# Patient Record
Sex: Female | Born: 1970 | Race: Black or African American | Hispanic: No | Marital: Single | Smoking: Never smoker
Health system: Southern US, Community
[De-identification: ages and names within clinical notes are randomized; demographics above are authoritative.]

## PROBLEM LIST (undated history)

## (undated) DIAGNOSIS — G43909 Migraine, unspecified, not intractable, without status migrainosus: Secondary | ICD-10-CM

## (undated) DIAGNOSIS — N760 Acute vaginitis: Secondary | ICD-10-CM

## (undated) DIAGNOSIS — F419 Anxiety disorder, unspecified: Secondary | ICD-10-CM

## (undated) DIAGNOSIS — M1990 Unspecified osteoarthritis, unspecified site: Secondary | ICD-10-CM

## (undated) DIAGNOSIS — N390 Urinary tract infection, site not specified: Secondary | ICD-10-CM

## (undated) DIAGNOSIS — B9689 Other specified bacterial agents as the cause of diseases classified elsewhere: Secondary | ICD-10-CM

## (undated) HISTORY — PX: ABDOMINAL HYSTERECTOMY: SHX81

## (undated) HISTORY — DX: Migraine, unspecified, not intractable, without status migrainosus: G43.909

---

## 2003-05-02 ENCOUNTER — Other Ambulatory Visit: Admission: RE | Admit: 2003-05-02 | Discharge: 2003-05-02 | Payer: Self-pay | Admitting: *Deleted

## 2003-05-06 ENCOUNTER — Emergency Department (HOSPITAL_COMMUNITY): Admission: EM | Admit: 2003-05-06 | Discharge: 2003-05-06 | Payer: Self-pay | Admitting: Emergency Medicine

## 2003-11-09 ENCOUNTER — Emergency Department (HOSPITAL_COMMUNITY): Admission: EM | Admit: 2003-11-09 | Discharge: 2003-11-09 | Payer: Self-pay | Admitting: Family Medicine

## 2003-12-26 ENCOUNTER — Emergency Department (HOSPITAL_COMMUNITY): Admission: EM | Admit: 2003-12-26 | Discharge: 2003-12-26 | Payer: Self-pay | Admitting: Family Medicine

## 2004-05-06 ENCOUNTER — Other Ambulatory Visit: Admission: RE | Admit: 2004-05-06 | Discharge: 2004-05-06 | Payer: Self-pay | Admitting: Obstetrics and Gynecology

## 2004-05-15 ENCOUNTER — Encounter (INDEPENDENT_AMBULATORY_CARE_PROVIDER_SITE_OTHER): Payer: Self-pay | Admitting: *Deleted

## 2004-05-15 ENCOUNTER — Ambulatory Visit (HOSPITAL_COMMUNITY): Admission: RE | Admit: 2004-05-15 | Discharge: 2004-05-15 | Payer: Self-pay | Admitting: Obstetrics and Gynecology

## 2004-12-24 ENCOUNTER — Ambulatory Visit: Payer: Self-pay | Admitting: Internal Medicine

## 2005-01-19 ENCOUNTER — Emergency Department (HOSPITAL_COMMUNITY): Admission: EM | Admit: 2005-01-19 | Discharge: 2005-01-19 | Payer: Self-pay | Admitting: Family Medicine

## 2005-07-07 ENCOUNTER — Emergency Department (HOSPITAL_COMMUNITY): Admission: EM | Admit: 2005-07-07 | Discharge: 2005-07-07 | Payer: Self-pay | Admitting: Family Medicine

## 2005-10-18 ENCOUNTER — Emergency Department (HOSPITAL_COMMUNITY): Admission: EM | Admit: 2005-10-18 | Discharge: 2005-10-18 | Payer: Self-pay | Admitting: Family Medicine

## 2006-10-31 ENCOUNTER — Emergency Department (HOSPITAL_COMMUNITY): Admission: EM | Admit: 2006-10-31 | Discharge: 2006-10-31 | Payer: Self-pay | Admitting: Family Medicine

## 2007-09-09 ENCOUNTER — Emergency Department (HOSPITAL_COMMUNITY): Admission: EM | Admit: 2007-09-09 | Discharge: 2007-09-09 | Payer: Self-pay | Admitting: Family Medicine

## 2007-10-05 ENCOUNTER — Emergency Department (HOSPITAL_COMMUNITY): Admission: EM | Admit: 2007-10-05 | Discharge: 2007-10-06 | Payer: Self-pay | Admitting: Emergency Medicine

## 2007-10-11 ENCOUNTER — Ambulatory Visit: Payer: Self-pay | Admitting: Gastroenterology

## 2007-10-29 ENCOUNTER — Emergency Department (HOSPITAL_COMMUNITY): Admission: EM | Admit: 2007-10-29 | Discharge: 2007-10-29 | Payer: Self-pay | Admitting: Family Medicine

## 2009-11-10 ENCOUNTER — Emergency Department (HOSPITAL_COMMUNITY): Admission: EM | Admit: 2009-11-10 | Discharge: 2009-11-11 | Payer: Self-pay | Admitting: Emergency Medicine

## 2009-11-13 ENCOUNTER — Encounter: Admission: RE | Admit: 2009-11-13 | Discharge: 2009-11-13 | Payer: Self-pay | Admitting: Emergency Medicine

## 2010-04-06 ENCOUNTER — Emergency Department (HOSPITAL_COMMUNITY)
Admission: EM | Admit: 2010-04-06 | Discharge: 2010-04-06 | Payer: Self-pay | Source: Home / Self Care | Admitting: Emergency Medicine

## 2010-06-30 LAB — POCT URINALYSIS DIPSTICK
Glucose, UA: NEGATIVE mg/dL
Ketones, ur: NEGATIVE mg/dL
Specific Gravity, Urine: 1.01 (ref 1.005–1.030)
Urobilinogen, UA: 1 mg/dL (ref 0.0–1.0)

## 2010-06-30 LAB — GC/CHLAMYDIA PROBE AMP, GENITAL
Chlamydia, DNA Probe: NEGATIVE
GC Probe Amp, Genital: NEGATIVE

## 2010-08-20 ENCOUNTER — Inpatient Hospital Stay (INDEPENDENT_AMBULATORY_CARE_PROVIDER_SITE_OTHER)
Admission: RE | Admit: 2010-08-20 | Discharge: 2010-08-20 | Disposition: A | Discharge status: Home / Self Care | Payer: 59 | Source: Ambulatory Visit | Attending: Family Medicine | Admitting: Family Medicine

## 2010-08-20 ENCOUNTER — Ambulatory Visit (INDEPENDENT_AMBULATORY_CARE_PROVIDER_SITE_OTHER): Payer: 59

## 2010-08-20 DIAGNOSIS — M549 Dorsalgia, unspecified: Secondary | ICD-10-CM

## 2010-08-20 DIAGNOSIS — N760 Acute vaginitis: Secondary | ICD-10-CM

## 2010-08-20 LAB — POCT URINALYSIS DIP (DEVICE)
Bilirubin Urine: NEGATIVE
Glucose, UA: NEGATIVE mg/dL
Hgb urine dipstick: NEGATIVE
Nitrite: NEGATIVE
Specific Gravity, Urine: 1.025 (ref 1.005–1.030)

## 2010-08-20 LAB — WET PREP, GENITAL
Trich, Wet Prep: NONE SEEN
Yeast Wet Prep HPF POC: NONE SEEN

## 2010-08-21 LAB — GC/CHLAMYDIA PROBE AMP, GENITAL: Chlamydia, DNA Probe: NEGATIVE

## 2010-08-22 LAB — POCT I-STAT, CHEM 8
BUN: 9 mg/dL (ref 6–23)
Calcium, Ion: 1.15 mmol/L (ref 1.12–1.32)
Chloride: 104 mEq/L (ref 96–112)
Glucose, Bld: 88 mg/dL (ref 70–99)
TCO2: 25 mmol/L (ref 0–100)

## 2010-09-05 NOTE — Op Note (Signed)
[name], [name] NO.:  [id number]   MEDICAL RECORD NO.:  [id number]          PATIENT TYPE:  AMB   LOCATION:  SDC                           FACILITY:  [facility]   PHYSICIAN:  Juluis Mire, M.D.   DATE OF BIRTH:  10/25/1970   DATE OF PROCEDURE:  05/15/2004  DATE OF DISCHARGE:                                 OPERATIVE REPORT   PREOPERATIVE DIAGNOSES:  1.  Menorrhagia.  2.  Dysmenorrhea.   POSTOPERATIVE DIAGNOSIS:  Adenomyosis.   OPERATIVE PROCEDURES:  1.  Diagnostic laparoscopy.  2.  Hysteroscopy.  3.  Endometrial curettings.  4.  NovaSure ablation.   SURGEON:  Juluis Mire, M.D.   ANESTHESIA:  General.   ESTIMATED BLOOD LOSS:  Minimal.   PACKS AND DRAINS:  None.   </INTRAOPERATIVE BLOOD REPLACED>  None.   COMPLICATIONS:  None.   INDICATIONS:  Dictated in the history and physical.   The procedure was as follows:  The patient was taken to the OR and placed in  the supine position.  After a satisfactory level of general endotracheal  anesthesia was obtained, the patient was placed in the dorsal lithotomy  position using the Allen stirrups.  The abdomen, perineum and vagina were  prepped out with Betadine.  The bladder was emptied by in-and-out  catheterization.  A Hulka tenaculum was put in place and secured.  The  patient was draped as a sterile field.  A subumbilical incision made with a  knife.  The Veress needle was introduced into the abdominal cavity.  The  abdomen was inflated with approximately 3 L of carbon dioxide.  The  operating laparoscope was then introduced.  There was no evidence of injury  to adjacent organs.  The appendix was visualized and noted to be normal.  The upper abdomen including the liver and tip of the gallbladder was clear.  A 5 mm trocar was put in place in the suprapubic area under direct  visualization.  The uterus was elevated.  The uterus was grossly enlarged,  consistent with probable adenomyosis.  Tubes and  ovaries were otherwise  unremarkable except for evidence of a previous bilateral tubal ligation.  She had some minimal adhesions at the bladder flap area from prior cesarean  sections.  There was no active endometriosis or other pelvic adhesions.  At  this point in time the abdomen was deflated of its carbon dioxide, all  trocars removed.  The subumbilical incision was closed with interrupted  subcuticular of 4-0 Vicryl.  The suprapubic incision was closed with  Dermabond.   The patient's legs were repositioned, the Hulka tenaculum then removed.  The  speculum was placed in the vaginal vault.  The cervix was grasped with a  single-tooth tenaculum.  The cervical length was measured at 4.5 cm.  Endometrial sounding was 9 cm.  The cervix was serially dilated to a size 27  Pratt dilator.  The nonoperative hysteroscope was introduced.  The  intrauterine cavity was distended using lactated Ringer's.  Visualization  revealed no evidence of intrauterine abnormalities.  The hysteroscope was  then removed.  Endometrial  curettings were obtained.  At this point in time  the NovaSure was introduced.  We had a total cavity length of 4.5 cm.  It  was properly positioned.  We ended up with a cavity width of 5 cm.  We did  pass the CO2 test.  We then did the endometrial ablation that lasted 1  minute 40 seconds to a total power of 124.  The NovaSure was then removed  intact.  The single-tooth tenaculum and speculum were then removed, the  patient taken out of the dorsal lithotomy position and once alert and  extubated, transferred to the recovery room in good condition.  Sponge,  instrument and needle count reported as correct by the circulating nurse x2.      JSM/MEDQ  D:  05/15/2004  T:  05/15/2004  Job:  [id number]

## 2010-09-05 NOTE — H&P (Signed)
NAME:  [name], WEIR NO.:  [id number]   MEDICAL RECORD NO.:  [id number]          PATIENT TYPE:  AMB   LOCATION:  SDC                           FACILITY:  [facility]   PHYSICIAN:  Juluis Mire, M.D.   DATE OF BIRTH:  1971/03/29   DATE OF ADMISSION:  05/15/2004  DATE OF DISCHARGE:                                HISTORY & PHYSICAL   HISTORY OF PRESENT ILLNESS:  The patient is a 40 year old gravida 2, para 2  female who presents for diagnostic laparoscopy.  She is also going to have a  hysteroscopy and NovaSure ablation.   In relation to the present admission, the patient's cycles are regular.  She  has 8-9 days of flow.  She will change pads and tampons 8-9 times per day.  She has associated pelvic pain and discomfort; she is also having pain with  intercourse.  She underwent a previous saline-infusion ultrasound.  There  were several fibroids, the largest measuring 2.4 cm.  The endometrial cavity  was unremarkable.  We felt like the majority of her bleeding was from an  adenomyosis and now she presents for the above-noted procedure.   ALLERGIES:  In terms of allergies, the patient has no known drug allergies.   MEDICATIONS:  Zyrtec.   PAST MEDICAL HISTORY:  Usual childhood diseases, no significant sequelae.   PAST SURGICAL HISTORY:  She has had 2 cesarean sections, her tubes tied with  the last.  She has also had a loop excision procedure of the cervix and  followup laser ablation; this was for cervical dysplasia.  Her last Pap  smear was within normal limits.   FAMILY HISTORY:  Her father has a history of insulin-dependent diabetes.  Mother died from lung cancer.   SOCIAL HISTORY:  There is no tobacco or alcohol use.   REVIEW OF SYSTEMS:  Review of systems is noncontributory.   PHYSICAL EXAM:  VITAL SIGNS:  The patient is afebrile with stable vital  signs.  HEENT:  Patient is normocephalic.  Pupils are equal, round and reactive to  light and accommodation.   Extraocular movements were intact.  Sclerae and  conjunctivae are clear.  Oropharynx clear.  NECK:  Neck without thyromegaly.  BREASTS:  No discrete masses.  LUNGS:  Clear.  CARDIOVASCULAR:  Regular rate and rhythm without murmurs or gallops.  ABDOMEN:  Her abdominal exam is benign, no mass, organomegaly or tenderness.  PELVIC:  Normal external genitalia.  Vaginal mucosa is clear.  Cervix is  unremarkable.  Uterus is upper limits of normal size, somewhat boggy.  Adnexa unremarkable.  EXTREMITIES:  Trace edema.  NEUROLOGIC:  Exam is grossly within normal limits.   IMPRESSION:  1.  Menorrhagia and dysmenorrhea probably secondary to adenomyosis versus      endometriosis.  2.  History of cervical dysplasia, treated with loop electrosurgical      excision procedure and laser in the past.   PLAN:  The patient will undergo diagnostic laparoscopy with laser standby  for evaluation of the pelvis, subsequent hysteroscopy and endometrial  ablation using NovaSure.  Success rates of 80% to  90% have been quoted.  The  risks of surgery were explained including the risk of infection; the risk of  hemorrhage that could require transfusion with the risk of acquired  immunodeficiency syndrome or hepatitis; there also might be the need for  hysterectomy; the risk of injury to adjacent organs requiring further  exploratory surgery; the risks of deep venous thrombosis and pulmonary  embolus; the patient expressed understanding of indications and risks.      JSM/MEDQ  D:  05/15/2004  T:  05/15/2004  Job:  [id number]

## 2010-10-13 ENCOUNTER — Other Ambulatory Visit: Payer: Self-pay | Admitting: *Deleted

## 2010-10-13 DIAGNOSIS — N6310 Unspecified lump in the right breast, unspecified quadrant: Secondary | ICD-10-CM

## 2010-10-16 ENCOUNTER — Ambulatory Visit
Admission: RE | Admit: 2010-10-16 | Discharge: 2010-10-16 | Disposition: A | Discharge status: Home / Self Care | Payer: 59 | Source: Ambulatory Visit | Attending: *Deleted | Admitting: *Deleted

## 2010-10-16 ENCOUNTER — Other Ambulatory Visit: Payer: Self-pay | Admitting: *Deleted

## 2010-10-16 DIAGNOSIS — N6310 Unspecified lump in the right breast, unspecified quadrant: Secondary | ICD-10-CM

## 2011-01-14 LAB — GC/CHLAMYDIA PROBE AMP, GENITAL
Chlamydia, DNA Probe: NEGATIVE
GC Probe Amp, Genital: NEGATIVE

## 2011-01-14 LAB — POCT URINALYSIS DIP (DEVICE)
Glucose, UA: NEGATIVE
Hgb urine dipstick: NEGATIVE
Ketones, ur: NEGATIVE
Nitrite: NEGATIVE
Operator id: 270961
Protein, ur: NEGATIVE
Specific Gravity, Urine: 1.025
Urobilinogen, UA: 4 — ABNORMAL HIGH
pH: 6.5

## 2011-01-14 LAB — WET PREP, GENITAL: Yeast Wet Prep HPF POC: NONE SEEN

## 2011-01-15 LAB — POCT URINALYSIS DIP (DEVICE)
Operator id: 235561
Protein, ur: 30 — AB
Urobilinogen, UA: 1
pH: 5.5

## 2011-01-15 LAB — GC/CHLAMYDIA PROBE AMP, GENITAL: GC Probe Amp, Genital: NEGATIVE

## 2011-01-15 LAB — WET PREP, GENITAL: Trich, Wet Prep: NONE SEEN

## 2011-01-15 LAB — POCT PREGNANCY, URINE
Operator id: 235561
Preg Test, Ur: NEGATIVE

## 2011-02-03 LAB — WET PREP, GENITAL: Clue Cells Wet Prep HPF POC: NONE SEEN

## 2011-02-03 LAB — POCT PREGNANCY, URINE
Operator id: 282151
Preg Test, Ur: NEGATIVE

## 2012-02-10 ENCOUNTER — Other Ambulatory Visit: Payer: Self-pay | Admitting: *Deleted

## 2012-02-10 DIAGNOSIS — Z1231 Encounter for screening mammogram for malignant neoplasm of breast: Secondary | ICD-10-CM

## 2012-02-16 ENCOUNTER — Ambulatory Visit
Admission: RE | Admit: 2012-02-16 | Discharge: 2012-02-16 | Disposition: A | Discharge status: Home / Self Care | Payer: 59 | Source: Ambulatory Visit | Attending: *Deleted | Admitting: *Deleted

## 2012-02-16 DIAGNOSIS — Z1231 Encounter for screening mammogram for malignant neoplasm of breast: Secondary | ICD-10-CM

## 2012-07-25 ENCOUNTER — Encounter (HOSPITAL_COMMUNITY): Payer: Self-pay | Admitting: Emergency Medicine

## 2012-07-25 ENCOUNTER — Other Ambulatory Visit (HOSPITAL_COMMUNITY)
Admission: RE | Admit: 2012-07-25 | Discharge: 2012-07-25 | Disposition: A | Discharge status: Home / Self Care | Payer: 59 | Source: Ambulatory Visit | Attending: Emergency Medicine | Admitting: Emergency Medicine

## 2012-07-25 ENCOUNTER — Emergency Department (INDEPENDENT_AMBULATORY_CARE_PROVIDER_SITE_OTHER)
Admission: EM | Admit: 2012-07-25 | Discharge: 2012-07-25 | Disposition: A | Discharge status: Home / Self Care | Payer: 59 | Source: Home / Self Care | Attending: Emergency Medicine | Admitting: Emergency Medicine

## 2012-07-25 DIAGNOSIS — R102 Pelvic and perineal pain: Secondary | ICD-10-CM

## 2012-07-25 DIAGNOSIS — N898 Other specified noninflammatory disorders of vagina: Secondary | ICD-10-CM

## 2012-07-25 DIAGNOSIS — Z113 Encounter for screening for infections with a predominantly sexual mode of transmission: Secondary | ICD-10-CM | POA: Insufficient documentation

## 2012-07-25 DIAGNOSIS — N760 Acute vaginitis: Secondary | ICD-10-CM | POA: Insufficient documentation

## 2012-07-25 LAB — POCT URINALYSIS DIP (DEVICE)
Bilirubin Urine: NEGATIVE
Hgb urine dipstick: NEGATIVE
Ketones, ur: NEGATIVE mg/dL
Protein, ur: NEGATIVE mg/dL
pH: 5.5 (ref 5.0–8.0)

## 2012-07-25 MED ORDER — FLUCONAZOLE 100 MG PO TABS: 150.0000 mg | ORAL_TABLET | Freq: Every day | ORAL | Status: DC

## 2012-07-25 MED ORDER — METRONIDAZOLE 500 MG PO TABS: 500.0000 mg | ORAL_TABLET | Freq: Two times a day (BID) | ORAL | Status: AC

## 2012-07-25 NOTE — ED Notes (Signed)
Sent to bathroom to collect specimens

## 2012-07-25 NOTE — ED Notes (Addendum)
Vaginal discharge for one week.  Burning with urination.  Denies abdominal pain, denies back pain.  Denies nausea or vomiting.  Reports foul odor and itching.  Patient reports she thinks this is a yeast infection.  Patient also requesting something for constipation

## 2012-07-25 NOTE — ED Provider Notes (Signed)
History     CSN: [id number]  Arrival date & time 07/25/12  1203   First MD Initiated Contact with Patient 07/25/12 1238      Chief Complaint  Patient presents with  . Vaginal Discharge    (Consider location/radiation/quality/duration/timing/severity/associated sxs/prior treatment) HPI Comments: Presents urgent care describing a vaginal discharge that hasbeen occurring for about a week. She also, describes that he has a patch over. Patient goes into describing that she feels is a mother yeast infection she has had many in the past. She has a very low-dose patient for a sexually transmitted illness. Does describe however that she has seen her doctor recently for an ongoing mild to moderate pelvic pain. Her Dr. at over an ultrasound. She recently missed the appointment and is planning on calling today to reschedule. Patient denies any fevers, flank pain nausea vomiting or severe pain. Has had some discomfort with urination as well, but denies pain or burning.  Patient is a 42 y.o. female presenting with vaginal discharge. The history is provided by the patient.  Vaginal Discharge This is a new problem. The current episode started more than 1 week ago. The problem occurs constantly. The problem has not changed since onset.Associated symptoms include abdominal pain. Pertinent negatives include no headaches and no shortness of breath. Nothing relieves the symptoms. Treatments tried: boric acid capsules. The treatment provided no relief.    History reviewed. No pertinent past medical history.  Past Surgical History  Procedure Laterality Date  . Cesarean section      No family history on file.  History  Substance Use Topics  . Smoking status: Never Smoker   . Smokeless tobacco: Not on file  . Alcohol Use: No    OB History   Grav Para Term Preterm Abortions TAB SAB Ect Mult Living                  Review of Systems  Constitutional: Negative for fever, chills, diaphoresis, activity  change, appetite change, fatigue and unexpected weight change.  Respiratory: Negative for shortness of breath.   Gastrointestinal: Positive for abdominal pain. Negative for constipation, blood in stool and anal bleeding.  Genitourinary: Positive for vaginal discharge and pelvic pain. Negative for dysuria, frequency, flank pain, decreased urine volume and vaginal pain.  Skin: Negative for rash.  Neurological: Negative for headaches.    Allergies  Review of patient's allergies indicates no known allergies.  Home Medications   Current Outpatient Rx  Name  Route  Sig  Dispense  Refill  . ALPRAZolam (XANAX) 0.5 MG tablet   Oral   Take 0.5 mg by mouth at bedtime as needed for sleep.         . Boric Acid POWD   Does not apply   by Does not apply route.         . fluconazole (DIFLUCAN) 100 MG tablet   Oral   Take 1.5 tablets (150 mg total) by mouth daily.   2 tablet   0   . metroNIDAZOLE (FLAGYL) 500 MG tablet   Oral   Take 1 tablet (500 mg total) by mouth 2 (two) times daily.   14 tablet   0     BP 97/63  Pulse 74  Temp(Src) 97.7 F (36.5 C) (Oral)  Resp 16  SpO2 95%  Physical Exam  Nursing note and vitals reviewed. Constitutional: She appears well-developed and well-nourished.  Abdominal: Soft. She exhibits no distension and no mass. There is no splenomegaly or hepatomegaly.  There is tenderness in the suprapubic area. There is no rigidity, no rebound, no guarding, no CVA tenderness, no tenderness at McBurney's point and negative Murphy's sign. No hernia. Hernia confirmed negative in the ventral area.    Neurological: She is alert.  Skin: No erythema.    ED Course  Procedures (including critical care time)  Labs Reviewed  POCT URINALYSIS DIP (DEVICE) - Abnormal; Notable for the following:    Leukocytes, UA TRACE (*)    All other components within normal limits  POCT PREGNANCY, URINE  URINE CYTOLOGY ANCILLARY ONLY   No results found.   1. Vaginal  discharge   2. Pelvic pain       MDM  Problem #1 vaginal discharge ( patient refused pelvic exam), urine sample- collected screened for both bacterial vaginosis and Candida along with STD screening. A prescription for both Flagyl and Diflucan have been given to patient today.  Problem #2 stable ongoing pelvic pain, patient is undergoing evaluation with her primary care Dr. At [facility], for suspicion of ovarian cyst. Patient missed her appointment for a transvaginal ultrasound and will reschedule by calling today in followup her primary care doctor. We discussed that this diagnostic process is imperative to rule out any potential malignancy or serious alternative have also instructed her that if pain was to become worse or change or fevers should go to the emergency department for further evaluation. She agrees and understands         Jimmie Molly, MD 07/25/12 1416

## 2012-07-26 NOTE — ED Notes (Signed)
Pt  Called  Requesting  Flagyl  Vaginal  Instead  Of     Flagyl po -metrogel  1  Percent   Daily  X  1  Week   Phoned    To  [facility]  [address]

## 2012-07-28 NOTE — ED Notes (Signed)
Gc/Chlamydia neg., Affirm: Candida and Trich neg., Gardnerella and Prevotella Bivia pos.  Pt. adequately treated with Flagyl. Vassie Moselle 07/28/2012

## 2012-11-30 ENCOUNTER — Encounter (HOSPITAL_BASED_OUTPATIENT_CLINIC_OR_DEPARTMENT_OTHER): Payer: Self-pay | Admitting: *Deleted

## 2012-11-30 ENCOUNTER — Emergency Department (HOSPITAL_BASED_OUTPATIENT_CLINIC_OR_DEPARTMENT_OTHER)
Admission: EM | Admit: 2012-11-30 | Discharge: 2012-11-30 | Disposition: A | Discharge status: Home / Self Care | Payer: 59 | Attending: Emergency Medicine | Admitting: Emergency Medicine

## 2012-11-30 DIAGNOSIS — R51 Headache: Secondary | ICD-10-CM | POA: Insufficient documentation

## 2012-11-30 DIAGNOSIS — Z79899 Other long term (current) drug therapy: Secondary | ICD-10-CM | POA: Insufficient documentation

## 2012-11-30 DIAGNOSIS — Z792 Long term (current) use of antibiotics: Secondary | ICD-10-CM | POA: Insufficient documentation

## 2012-11-30 DIAGNOSIS — L02419 Cutaneous abscess of limb, unspecified: Secondary | ICD-10-CM | POA: Insufficient documentation

## 2012-11-30 DIAGNOSIS — R079 Chest pain, unspecified: Secondary | ICD-10-CM | POA: Insufficient documentation

## 2012-11-30 DIAGNOSIS — L0390 Cellulitis, unspecified: Secondary | ICD-10-CM

## 2012-11-30 DIAGNOSIS — R112 Nausea with vomiting, unspecified: Secondary | ICD-10-CM | POA: Insufficient documentation

## 2012-11-30 MED ORDER — CLINDAMYCIN HCL 150 MG PO CAPS: 150.0000 mg | ORAL_CAPSULE | Freq: Four times a day (QID) | ORAL | Status: DC

## 2012-11-30 MED ORDER — ONDANSETRON HCL 4 MG/2ML IJ SOLN: 4.0000 mg | Freq: Once | INTRAMUSCULAR | Status: DC

## 2012-11-30 MED ORDER — ONDANSETRON 4 MG PO TBDP
4.0000 mg | ORAL_TABLET | Freq: Once | ORAL | Status: AC
Start: 2012-11-30 — End: 2012-11-30
  Administered 2012-11-30: 4 mg via ORAL

## 2012-11-30 MED ORDER — ONDANSETRON 4 MG PO TBDP
ORAL_TABLET | ORAL | Status: AC
  Administered 2012-11-30: 4 mg via ORAL
  Filled 2012-11-30: qty 1

## 2012-11-30 NOTE — ED Provider Notes (Signed)
CSN: [id number]     Arrival date & time 11/30/12  1400 History     First MD Initiated Contact with Patient 11/30/12 1425     Chief Complaint  Patient presents with  . Insect Bite   (Consider location/radiation/quality/duration/timing/severity/associated sxs/prior Treatment) The history is provided by the patient.   Patient presents with concern for redness and swelling of the left. The patient knew 2 days he had increasing redness and swelling over a small area of the left thigh. It has become increasingly painful. She denies any fevers but has had nausea and one episode of vomiting last night. She also states she had headache last night but that has resolved. She denies any other symptoms including shortness of breath, chest pain, abdominal pain, urinary symptoms, focal weakness or numbness.  History reviewed. No pertinent past medical history. Past Surgical History  Procedure Laterality Date  . Cesarean section    . Abdominal hysterectomy     History reviewed. No pertinent family history. History  Substance Use Topics  . Smoking status: Never Smoker   . Smokeless tobacco: Not on file  . Alcohol Use: No   OB History   Grav Para Term Preterm Abortions TAB SAB Ect Mult Living                 Review of Systems  Constitutional: Negative.  Negative for fever.  Respiratory: Negative.  Negative for cough, chest tightness and shortness of breath.   Cardiovascular: Positive for chest pain.  Gastrointestinal: Positive for nausea. Negative for abdominal pain.  Musculoskeletal: Negative for arthralgias.  Skin: Positive for color change.  Psychiatric/Behavioral: Negative.   All other systems reviewed and are negative.    Allergies  Review of patient's allergies indicates no known allergies.  Home Medications   Current Outpatient Rx  Name  Route  Sig  Dispense  Refill  . ALPRAZolam (XANAX) 0.5 MG tablet   Oral   Take 0.5 mg by mouth at bedtime as needed for sleep.          . Boric Acid POWD   Does not apply   by Does not apply route.         . clindamycin (CLEOCIN) 150 MG capsule   Oral   Take 1 capsule (150 mg total) by mouth every 6 (six) hours.   28 capsule   0   . fluconazole (DIFLUCAN) 100 MG tablet   Oral   Take 1.5 tablets (150 mg total) by mouth daily.   2 tablet   0    BP 108/73  Pulse 75  Temp(Src) 98.1 F (36.7 C) (Oral)  Resp 16  Ht 5\' 6"  (1.676 m)  Wt 130 lb (58.968 kg)  BMI 20.99 kg/m2  SpO2 100% Physical Exam  Constitutional: She is oriented to person, place, and time. She appears well-developed and well-nourished.  HENT:  Head: Normocephalic and atraumatic.  Eyes: Pupils are equal, round, and reactive to light.  Cardiovascular: Normal rate and regular rhythm.   Pulmonary/Chest: Effort normal and breath sounds normal. No respiratory distress.  Abdominal: Soft. Bowel sounds are normal.  Neurological: She is alert and oriented to person, place, and time.  Skin: Skin is warm and dry.  There is a 2 and half diameter area of induration and redness to the left lateral thigh. There is no evidence of pustule or insect bite.  Psychiatric: She has a normal mood and affect.    ED Course   Procedures (including critical care time)  Labs Reviewed - No data to display No results found. 1. Cellulitis     MDM  This is a 42 year old female who presents with redness and swelling of the left thigh. She is nontoxic appearing on exam and her vital signs are within normal limits. She has had one episode of emesis and nausea during this time. she has evidence of an area of induration and redness over the left lateral thigh concerning for cellulitis versus abscess.  Bedside ultrasound does not show evidence of drainable fluid collection. The patient will be sent home on clindamycin for cellulitis. She was told to use warm compresses. She is to return if she has increasing swelling, redness, fevers or any other worsening  symptoms.  Shon Baton, MD 11/30/12 1515

## 2012-11-30 NOTE — ED Notes (Signed)
Pt reports insect bite to left thigh.  Pt reports n/v and and light headedness.

## 2013-01-11 ENCOUNTER — Other Ambulatory Visit: Payer: Self-pay

## 2013-02-22 ENCOUNTER — Encounter (HOSPITAL_BASED_OUTPATIENT_CLINIC_OR_DEPARTMENT_OTHER): Payer: Self-pay | Admitting: Emergency Medicine

## 2013-02-22 ENCOUNTER — Emergency Department (HOSPITAL_BASED_OUTPATIENT_CLINIC_OR_DEPARTMENT_OTHER)
Admission: EM | Admit: 2013-02-22 | Discharge: 2013-02-22 | Disposition: A | Discharge status: Home / Self Care | Payer: 59 | Attending: Emergency Medicine | Admitting: Emergency Medicine

## 2013-02-22 DIAGNOSIS — A499 Bacterial infection, unspecified: Secondary | ICD-10-CM | POA: Insufficient documentation

## 2013-02-22 DIAGNOSIS — N760 Acute vaginitis: Secondary | ICD-10-CM | POA: Insufficient documentation

## 2013-02-22 DIAGNOSIS — B9689 Other specified bacterial agents as the cause of diseases classified elsewhere: Secondary | ICD-10-CM | POA: Insufficient documentation

## 2013-02-22 DIAGNOSIS — Z79899 Other long term (current) drug therapy: Secondary | ICD-10-CM | POA: Insufficient documentation

## 2013-02-22 LAB — URINALYSIS, ROUTINE W REFLEX MICROSCOPIC
Bilirubin Urine: NEGATIVE
Glucose, UA: NEGATIVE mg/dL
Hgb urine dipstick: NEGATIVE
Specific Gravity, Urine: 1.014 (ref 1.005–1.030)
pH: 6.5 (ref 5.0–8.0)

## 2013-02-22 LAB — WET PREP, GENITAL
Trich, Wet Prep: NONE SEEN
Yeast Wet Prep HPF POC: NONE SEEN

## 2013-02-22 LAB — URINE MICROSCOPIC-ADD ON

## 2013-02-22 MED ORDER — METRONIDAZOLE 500 MG PO TABS: 500.0000 mg | ORAL_TABLET | Freq: Two times a day (BID) | ORAL | Status: DC

## 2013-02-22 NOTE — ED Notes (Signed)
C/o vaginal d/c, foul smelling urine and "my breast stink"

## 2013-02-22 NOTE — ED Provider Notes (Signed)
Medical screening examination/treatment/procedure(s) were performed by non-physician practitioner and as supervising physician I was immediately available for consultation/collaboration.  EKG Interpretation   None         Layla Maw Ward, DO 02/22/13 2328

## 2013-02-22 NOTE — ED Provider Notes (Signed)
CSN: [id number]     Arrival date & time 02/22/13  2058 History   First MD Initiated Contact with Patient 02/22/13 2107     Chief Complaint  Patient presents with  . Vaginal Discharge   (Consider location/radiation/quality/duration/timing/severity/associated sxs/prior Treatment) Patient is a 42 y.o. female presenting with vaginal discharge. The history is provided by the patient. No language interpreter was used.  Vaginal Discharge Quality:  Thick and white Severity:  Moderate Timing:  Constant Progression:  Worsening Chronicity:  New Relieved by:  Nothing Associated symptoms: no abdominal pain    Pt complains of vaginal discharge and breast odor.  NO nipple drainage.  No breast mass History reviewed. No pertinent past medical history. Past Surgical History  Procedure Laterality Date  . Cesarean section    . Abdominal hysterectomy     No family history on file. History  Substance Use Topics  . Smoking status: Never Smoker   . Smokeless tobacco: Not on file  . Alcohol Use: No   OB History   Grav Para Term Preterm Abortions TAB SAB Ect Mult Living                 Review of Systems  Gastrointestinal: Negative for abdominal pain.  Genitourinary: Positive for vaginal discharge.  All other systems reviewed and are negative.    Allergies  Review of patient's allergies indicates no known allergies.  Home Medications   Current Outpatient Rx  Name  Route  Sig  Dispense  Refill  . ALPRAZolam (XANAX) 0.5 MG tablet   Oral   Take 0.5 mg by mouth at bedtime as needed for sleep.         . Boric Acid POWD   Does not apply   by Does not apply route.         . clindamycin (CLEOCIN) 150 MG capsule   Oral   Take 1 capsule (150 mg total) by mouth every 6 (six) hours.   28 capsule   0   . fluconazole (DIFLUCAN) 100 MG tablet   Oral   Take 1.5 tablets (150 mg total) by mouth daily.   2 tablet   0    BP 110/70  Pulse 73  Temp(Src) 98.6 F (37 C) (Oral)  Resp 18   Ht 5\' 6"  (1.676 m)  Wt 130 lb (58.968 kg)  BMI 20.99 kg/m2  SpO2 100% Physical Exam  Nursing note and vitals reviewed. Constitutional: She is oriented to person, place, and time. She appears well-developed.  HENT:  Head: Normocephalic.  Right Ear: External ear normal.  Mouth/Throat: Oropharynx is clear and moist.  Eyes: Conjunctivae and EOM are normal. Pupils are equal, round, and reactive to light.  Neck: Normal range of motion.  Cardiovascular: Normal rate and regular rhythm.   Pulmonary/Chest: Effort normal and breath sounds normal.  Abdominal: Soft. There is no tenderness.  Genitourinary: Vaginal discharge found.  Thick white discharge  Musculoskeletal: Normal range of motion.  Neurological: She is alert and oriented to person, place, and time. She has normal reflexes.  Skin: Skin is warm.  Psychiatric: She has a normal mood and affect.    ED Course  Procedures (including critical care time) Labs Review Labs Reviewed  WET PREP, GENITAL - Abnormal; Notable for the following:    Clue Cells Wet Prep HPF POC MANY (*)    WBC, Wet Prep HPF POC FEW (*)    All other components within normal limits  URINALYSIS, ROUTINE W REFLEX MICROSCOPIC - Abnormal; Notable  for the following:    Nitrite POSITIVE (*)    Leukocytes, UA TRACE (*)    All other components within normal limits  URINE MICROSCOPIC-ADD ON - Abnormal; Notable for the following:    Bacteria, UA MANY (*)    All other components within normal limits  GC/CHLAMYDIA PROBE AMP   Imaging Review No results found.  EKG Interpretation   None       MDM   1. BV (bacterial vaginosis)    Flagyl  For bv.    Pt advised to try lotrimin cream below breast.   Possible yeast,  followup with her md    Elson Areas, PA-C 02/22/13 2132

## 2013-02-23 LAB — GC/CHLAMYDIA PROBE AMP
CT Probe RNA: NEGATIVE
GC Probe RNA: NEGATIVE

## 2013-02-24 LAB — URINE CULTURE

## 2013-02-25 NOTE — ED Notes (Signed)
Post ED Visit - Positive Culture Follow-up: Successful Patient Follow-Up  + Urine Culture X] Patient discharged originally without antimicrobial agent and treatment is now indicated  New antibiotic prescription: Cephalexin 250mg  po 4 times daily for 10 days.  ED Provider: Irish Elders, FNP-C  Christoper Fabian, PharmD, BCPS  Larena Sox 02/25/2013, 7:13 PM

## 2013-02-25 NOTE — Progress Notes (Signed)
ED Antimicrobial Stewardship Positive Culture Follow Up   [name] is an 42 y.o. female who presented to [facility] on 02/22/2013 with a chief complaint of  Chief Complaint  Patient presents with  . Vaginal Discharge    Specimen Description URINE, CLEAN CATCH    Special Requests NONE    Culture Setup Time 02/23/2013 01:19 Performed at [facility] Count >=100,000 COLONIES/ML Performed at [facility]   Culture ESCHERICHIA COLI Performed at [facility]   Report Status 02/24/2013 FINAL    Organism ID, Bacteria ESCHERICHIA COLI    Resulting Agency [facility]   Culture & Susceptibility    Antibiotic  Organism Organism Organism     ESCHERICHIA COLI     AMPICILLIN  8 SENSITIVE S Final       CEFAZOLIN  <=4 SENSITIVE S Final       CEFTRIAXONE  <=1 SENSITIVE S Final       CIPROFLOXACIN  <=0.25 SENSITIVE S Final       GENTAMICIN  <=1 SENSITIVE S Final       LEVOFLOXACIN  <=0.12 SENSITIVE S Final       NITROFURANTOIN  32 SENSITIVE S Final       PIP/TAZO  <=4 SENSITIVE S Final       TOBRAMYCIN  <=1 SENSITIVE S Final       TRIMETH/SULFA  <=20 SENSITIVE S          [x]  Patient discharged originally without antimicrobial agent and treatment is now indicated  New antibiotic prescription: Cephalexin 250mg  po 4 times daily for 10 days.  ED Provider: Irish Elders, FNP-C  Christoper Fabian, PharmD, BCPS Clinical pharmacist, pager [phone] 02/25/2013, 11:26 AM

## 2013-02-26 ENCOUNTER — Telehealth (HOSPITAL_COMMUNITY): Payer: Self-pay | Admitting: *Deleted

## 2013-04-17 ENCOUNTER — Other Ambulatory Visit: Payer: Self-pay

## 2013-04-17 DIAGNOSIS — Z1231 Encounter for screening mammogram for malignant neoplasm of breast: Secondary | ICD-10-CM

## 2013-05-08 ENCOUNTER — Ambulatory Visit: Payer: 59

## 2013-05-09 ENCOUNTER — Ambulatory Visit
Admission: RE | Admit: 2013-05-09 | Discharge: 2013-05-09 | Disposition: A | Discharge status: Home / Self Care | Payer: 59 | Source: Ambulatory Visit

## 2013-05-09 DIAGNOSIS — Z1231 Encounter for screening mammogram for malignant neoplasm of breast: Secondary | ICD-10-CM

## 2013-05-28 ENCOUNTER — Encounter (HOSPITAL_BASED_OUTPATIENT_CLINIC_OR_DEPARTMENT_OTHER): Payer: Self-pay | Admitting: Emergency Medicine

## 2013-05-28 ENCOUNTER — Emergency Department (HOSPITAL_BASED_OUTPATIENT_CLINIC_OR_DEPARTMENT_OTHER)
Admission: EM | Admit: 2013-05-28 | Discharge: 2013-05-28 | Disposition: A | Discharge status: Home / Self Care | Payer: 59 | Attending: Emergency Medicine | Admitting: Emergency Medicine

## 2013-05-28 DIAGNOSIS — N898 Other specified noninflammatory disorders of vagina: Secondary | ICD-10-CM | POA: Insufficient documentation

## 2013-05-28 DIAGNOSIS — A499 Bacterial infection, unspecified: Secondary | ICD-10-CM | POA: Insufficient documentation

## 2013-05-28 DIAGNOSIS — Z792 Long term (current) use of antibiotics: Secondary | ICD-10-CM | POA: Insufficient documentation

## 2013-05-28 DIAGNOSIS — F411 Generalized anxiety disorder: Secondary | ICD-10-CM | POA: Insufficient documentation

## 2013-05-28 DIAGNOSIS — R1031 Right lower quadrant pain: Secondary | ICD-10-CM | POA: Insufficient documentation

## 2013-05-28 DIAGNOSIS — B9689 Other specified bacterial agents as the cause of diseases classified elsewhere: Secondary | ICD-10-CM | POA: Insufficient documentation

## 2013-05-28 DIAGNOSIS — IMO0002 Reserved for concepts with insufficient information to code with codable children: Secondary | ICD-10-CM | POA: Insufficient documentation

## 2013-05-28 DIAGNOSIS — N760 Acute vaginitis: Secondary | ICD-10-CM | POA: Insufficient documentation

## 2013-05-28 LAB — URINALYSIS, ROUTINE W REFLEX MICROSCOPIC
GLUCOSE, UA: NEGATIVE mg/dL
HGB URINE DIPSTICK: NEGATIVE
Ketones, ur: NEGATIVE mg/dL
LEUKOCYTES UA: NEGATIVE
Nitrite: NEGATIVE
Protein, ur: NEGATIVE mg/dL
SPECIFIC GRAVITY, URINE: 1.027 (ref 1.005–1.030)
UROBILINOGEN UA: 1 mg/dL (ref 0.0–1.0)
pH: 5.5 (ref 5.0–8.0)

## 2013-05-28 LAB — WET PREP, GENITAL
Clue Cells Wet Prep HPF POC: NONE SEEN
TRICH WET PREP: NONE SEEN
YEAST WET PREP: NONE SEEN

## 2013-05-28 MED ORDER — METRONIDAZOLE 500 MG PO TABS: 500.0000 mg | ORAL_TABLET | Freq: Two times a day (BID) | ORAL | Status: DC

## 2013-05-28 NOTE — ED Notes (Signed)
Had a UTI a few months ago, cleared up with septra and now the same symptoms are present. Vaginal discharge as well.

## 2013-05-28 NOTE — ED Provider Notes (Signed)
CSN: [id number]     Arrival date & time 05/28/13  1704 History   First MD Initiated Contact with Patient 05/28/13 2026     Chief Complaint  Patient presents with  . Urinary Frequency   (Consider location/radiation/quality/duration/timing/severity/associated sxs/prior Treatment) Patient is a 43 y.o. female presenting with vaginal discharge. The history is provided by the patient.  Vaginal Discharge Quality:  White Onset quality:  Gradual Duration:  10 days Timing:  Constant Progression:  Worsening Chronicity:  New Context: not recent antibiotic use   Relieved by:  Nothing Worsened by:  Nothing tried Ineffective treatments:  None tried Associated symptoms: abdominal pain, dyspareunia, urinary frequency and vaginal itching   Associated symptoms: no dysuria, no fever, no nausea, no rash, no urinary incontinence and no vomiting   Risk factors: new sexual partner and PID   Risk factors: no endometriosis, no STI, no STI exposure and no unprotected sex  Gynecological surgery: hysterectomy, c/s x2.   [name] is a 43 y.o. female who presents to the ED with vaginal discharge and frequent urination. She states she feel like her bladder has dropped. She has a new sex partner x 2 months but always uses condoms. The vaginal discharge has an odor.   History reviewed. No pertinent past medical history. Past Surgical History  Procedure Laterality Date  . Cesarean section    . Abdominal hysterectomy     No family history on file. History  Substance Use Topics  . Smoking status: Never Smoker   . Smokeless tobacco: Not on file  . Alcohol Use: No   OB History   Grav Para Term Preterm Abortions TAB SAB Ect Mult Living                 Review of Systems  Constitutional: Negative for fever.  Eyes: Negative for visual disturbance.  Respiratory: Negative for cough and shortness of breath.   Cardiovascular: Negative for chest pain.  Gastrointestinal: Positive for abdominal pain. Negative  for nausea and vomiting.  Genitourinary: Positive for vaginal discharge and dyspareunia. Negative for bladder incontinence and dysuria.  Skin: Negative for rash.  Neurological: Negative for light-headedness.  Psychiatric/Behavioral: The patient is nervous/anxious.     Allergies  Review of patient's allergies indicates no known allergies.  Home Medications   Current Outpatient Rx  Name  Route  Sig  Dispense  Refill  . ALPRAZolam (XANAX) 0.5 MG tablet   Oral   Take 0.5 mg by mouth at bedtime as needed for sleep.         . Boric Acid POWD   Does not apply   by Does not apply route.         . clindamycin (CLEOCIN) 150 MG capsule   Oral   Take 1 capsule (150 mg total) by mouth every 6 (six) hours.   28 capsule   0   . fluconazole (DIFLUCAN) 100 MG tablet   Oral   Take 1.5 tablets (150 mg total) by mouth daily.   2 tablet   0   . metroNIDAZOLE (FLAGYL) 500 MG tablet   Oral   Take 1 tablet (500 mg total) by mouth 2 (two) times daily.   14 tablet   0    BP 100/65  Pulse 60  Temp(Src) 97.9 F (36.6 C) (Oral)  Resp 18  Ht 5\' 6"  (1.676 m)  Wt 130 lb (58.968 kg)  BMI 20.99 kg/m2  SpO2 100% Physical Exam  Nursing note and vitals reviewed. Constitutional: She  is oriented to person, place, and time. She appears well-developed and well-nourished.  HENT:  Head: Normocephalic and atraumatic.  Eyes: Conjunctivae and EOM are normal.  Neck: Neck supple.  Cardiovascular: Normal rate and regular rhythm.   Pulmonary/Chest: Effort normal. She has no wheezes. She has no rales.  Abdominal: Soft. Bowel sounds are normal. There is tenderness in the right lower quadrant. There is no rebound, no guarding and no CVA tenderness.  Genitourinary:  External genitalia without lesions, frothy malodorous discharge vaginal vault. Cervix absent, no adnexal mass palpated. Tender right.   Musculoskeletal: Normal range of motion.  Neurological: She is alert and oriented to person, place, and  time. No cranial nerve deficit.  Skin: Skin is warm and dry.  Psychiatric: She has a normal mood and affect. Her behavior is normal.    ED Course  Procedures (including critical care time) Labs Review Results for orders placed during the hospital encounter of 05/28/13 (from the past 24 hour(s))  URINALYSIS, ROUTINE W REFLEX MICROSCOPIC     Status: Abnormal   Collection Time    05/28/13  5:14 PM      Result Value Range   Color, Urine YELLOW  YELLOW   APPearance CLEAR  CLEAR   Specific Gravity, Urine 1.027  1.005 - 1.030   pH 5.5  5.0 - 8.0   Glucose, UA NEGATIVE  NEGATIVE mg/dL   Hgb urine dipstick NEGATIVE  NEGATIVE   Bilirubin Urine SMALL (*) NEGATIVE   Ketones, ur NEGATIVE  NEGATIVE mg/dL   Protein, ur NEGATIVE  NEGATIVE mg/dL   Urobilinogen, UA 1.0  0.0 - 1.0 mg/dL   Nitrite NEGATIVE  NEGATIVE   Leukocytes, UA NEGATIVE  NEGATIVE  WET PREP, GENITAL     Status: Abnormal   Collection Time    05/28/13  9:10 PM      Result Value Range   Yeast Wet Prep HPF POC NONE SEEN  NONE SEEN   Trich, Wet Prep NONE SEEN  NONE SEEN   Clue Cells Wet Prep HPF POC NONE SEEN  NONE SEEN   WBC, Wet Prep HPF POC FEW (*) NONE SEEN     MDM  43 y.o. female with vaginal discharge with odor. No clue cells identified on wet prep but will treat clinical findings for BV. Patient to follow up with PCP or return here as needed.  I have reviewed this patient's vital signs, nurses notes, appropriate labs and imaging.  I have discussed findings and plan of care with the patient and she voices understanding.    Medication List    ASK your doctor about these medications       Boric Acid Powd  by Does not apply route.     clindamycin 150 MG capsule  Commonly known as:  CLEOCIN  Take 1 capsule (150 mg total) by mouth every 6 (six) hours.     fluconazole 100 MG tablet  Commonly known as:  DIFLUCAN  Take 1.5 tablets (150 mg total) by mouth daily.     metroNIDAZOLE 500 MG tablet  Commonly known as:   FLAGYL  Take 1 tablet (500 mg total) by mouth 2 (two) times daily.  Ask about: Which instructions should I use?     metroNIDAZOLE 500 MG tablet  Commonly known as:  FLAGYL  Take 1 tablet (500 mg total) by mouth 2 (two) times daily.  Ask about: Which instructions should I use?     XANAX 0.5 MG tablet  Generic drug:  ALPRAZolam  Take 0.5 mg by mouth at bedtime as needed for sleep.           [place]moor[place] M [place], [place] 05/28/13 2145

## 2013-05-28 NOTE — Discharge Instructions (Signed)
Your urine tonight does not appear infected. We are treating your vaginal discharge with an antibiotic. Follow up with your doctor. Return here as needed.

## 2013-06-02 NOTE — ED Provider Notes (Signed)
Medical screening examination/treatment/procedure(s) were performed by non-physician practitioner and as supervising physician I was immediately available for consultation/collaboration.    Ignatius Kloos L Josemiguel Gries, MD 06/02/13 0714 

## 2013-07-22 ENCOUNTER — Emergency Department (INDEPENDENT_AMBULATORY_CARE_PROVIDER_SITE_OTHER)
Admission: EM | Admit: 2013-07-22 | Discharge: 2013-07-22 | Disposition: A | Discharge status: Home / Self Care | Payer: 59 | Source: Home / Self Care | Attending: Family Medicine | Admitting: Family Medicine

## 2013-07-22 ENCOUNTER — Other Ambulatory Visit (HOSPITAL_COMMUNITY)
Admission: RE | Admit: 2013-07-22 | Discharge: 2013-07-22 | Disposition: A | Discharge status: Home / Self Care | Payer: 59 | Source: Ambulatory Visit | Attending: Family Medicine | Admitting: Family Medicine

## 2013-07-22 ENCOUNTER — Encounter (HOSPITAL_COMMUNITY): Payer: Self-pay | Admitting: Emergency Medicine

## 2013-07-22 DIAGNOSIS — Z113 Encounter for screening for infections with a predominantly sexual mode of transmission: Secondary | ICD-10-CM | POA: Insufficient documentation

## 2013-07-22 DIAGNOSIS — N760 Acute vaginitis: Secondary | ICD-10-CM

## 2013-07-22 HISTORY — DX: Other specified bacterial agents as the cause of diseases classified elsewhere: N76.0

## 2013-07-22 HISTORY — DX: Other specified bacterial agents as the cause of diseases classified elsewhere: B96.89

## 2013-07-22 HISTORY — DX: Urinary tract infection, site not specified: N39.0

## 2013-07-22 LAB — POCT URINALYSIS DIP (DEVICE)
Bilirubin Urine: NEGATIVE
Glucose, UA: NEGATIVE mg/dL
Hgb urine dipstick: NEGATIVE
Ketones, ur: NEGATIVE mg/dL
LEUKOCYTES UA: NEGATIVE
Nitrite: POSITIVE — AB
PH: 6 (ref 5.0–8.0)
PROTEIN: NEGATIVE mg/dL
Specific Gravity, Urine: 1.025 (ref 1.005–1.030)
Urobilinogen, UA: 2 mg/dL — ABNORMAL HIGH (ref 0.0–1.0)

## 2013-07-22 NOTE — ED Notes (Signed)
Reports being treated for BV approx 1-2 months ago; prior to that had UTI.  C/O vaginal discharge, vaginal itching, malodorous urine and dysuria x 3 days.  Denies fevers or abd pain.

## 2013-07-22 NOTE — ED Provider Notes (Signed)
Medical screening examination/treatment/procedure(s) were performed by resident physician or non-physician practitioner and as supervising physician I was immediately available for consultation/collaboration.   Reyes Aldaco DOUGLAS MD.   Hallelujah Wysong D Tristen Pennino, MD 07/22/13 1204 

## 2013-07-22 NOTE — ED Provider Notes (Signed)
CSN: [id number]     Arrival date & time 07/22/13  [id number] History   First MD Initiated Contact with Patient 07/22/13 1017     Chief Complaint  Patient presents with  . Vaginal Discharge  . Dysuria   (Consider location/radiation/quality/duration/timing/severity/associated sxs/prior Treatment) HPI Comments: No hematuria, pelvic pain, flank pain, fever, N/V  Patient is a 43 y.o. female presenting with vaginal discharge and dysuria. The history is provided by the patient.  Vaginal Discharge Quality:  White and watery Onset quality:  Gradual Duration:  4 days Progression:  Unchanged Chronicity:  Recurrent Associated symptoms: dysuria and vaginal itching   Associated symptoms: no abdominal pain, no dyspareunia, no fever, no genital lesions, no nausea, no rash, no urinary frequency, no urinary hesitancy, no urinary incontinence and no vomiting   Risk factors: no immunosuppression and no new sexual partner   Dysuria Associated symptoms: vaginal discharge   Associated symptoms: no abdominal pain, no fever, no genital lesions, no nausea and no vomiting     Past Medical History  Diagnosis Date  . BV (bacterial vaginosis)   . UTI (lower urinary tract infection)    Past Surgical History  Procedure Laterality Date  . Cesarean section    . Abdominal hysterectomy     No family history on file. History  Substance Use Topics  . Smoking status: Never Smoker   . Smokeless tobacco: Not on file  . Alcohol Use: No   OB History   Grav Para Term Preterm Abortions TAB SAB Ect Mult Living                 Review of Systems  Constitutional: Negative for fever.  Gastrointestinal: Negative for nausea, vomiting and abdominal pain.  Genitourinary: Positive for dysuria and vaginal discharge. Negative for bladder incontinence, hesitancy and dyspareunia.  All other systems reviewed and are negative.    Allergies  Review of patient's allergies indicates no known allergies.  Home Medications    Current Outpatient Rx  Name  Route  Sig  Dispense  Refill  . ALPRAZolam (XANAX) 0.5 MG tablet   Oral   Take 0.5 mg by mouth at bedtime as needed for sleep.         . Boric Acid POWD   Does not apply   by Does not apply route.         . clindamycin (CLEOCIN) 150 MG capsule   Oral   Take 1 capsule (150 mg total) by mouth every 6 (six) hours.   28 capsule   0   . fluconazole (DIFLUCAN) 100 MG tablet   Oral   Take 1.5 tablets (150 mg total) by mouth daily.   2 tablet   0   . metroNIDAZOLE (FLAGYL) 500 MG tablet   Oral   Take 1 tablet (500 mg total) by mouth 2 (two) times daily.   14 tablet   0   . metroNIDAZOLE (FLAGYL) 500 MG tablet   Oral   Take 1 tablet (500 mg total) by mouth 2 (two) times daily.   14 tablet   0    BP 196/68  Pulse 55  Temp(Src) 98.2 F (36.8 C) (Oral)  Resp 16  SpO2 100% Physical Exam  Nursing note and vitals reviewed. Constitutional: She is oriented to person, place, and time. She appears well-developed and well-nourished. No distress.  HENT:  Head: Normocephalic and atraumatic.  Eyes: Conjunctivae are normal.  Cardiovascular: Normal rate.   Pulmonary/Chest: Effort normal.  Abdominal: Soft. Bowel sounds are  normal. She exhibits no distension. There is no tenderness.  Genitourinary: Pelvic exam was performed with patient supine. There is no rash, tenderness or lesion on the right labia. There is no rash, tenderness or lesion on the left labia. No erythema, tenderness or bleeding around the vagina. No foreign body around the vagina. No signs of injury around the vagina. Vaginal discharge found.  S/P TAH  Musculoskeletal: Normal range of motion.  Neurological: She is alert and oriented to person, place, and time.  Skin: Skin is warm and dry. No rash noted.  Psychiatric: She has a normal mood and affect. Her behavior is normal.    ED Course  Procedures (including critical care time) Labs Review Labs Reviewed  POCT URINALYSIS DIP  (DEVICE) - Abnormal; Notable for the following:    Urobilinogen, UA 2.0 (*)    Nitrite POSITIVE (*)    All other components within normal limits  URINE CULTURE  CERVICOVAGINAL ANCILLARY ONLY   Imaging Review No results found.   MDM   1. Vaginitis     urine was without evidence of infection. held for 3day culture and if results indicate need for treatment, pt. will be notified. Results of your vaginal swabs should be available in the next 1-2 days and pt. will be notified of results and if treatment is necessary, medications can be called into pharmacy. Pt made aware that blood pressure is quite elevated at today's visit and she should schedule a follow up appointment with PCP to have this addressed in the next 7-10 days  Ardis Rowan, [place] 07/22/13 1109

## 2013-07-22 NOTE — Discharge Instructions (Signed)
Your urine was without evidence of infection. It will be help for 3day culture and if results indicate need for treatment, you will be notified. The results of your vaginal swabs should be available in the next 1-2 days and you will be notified of results and if treatment is necessary, medications can be called in to your pharmacy. You should be aware that your blood pressure is quite elevated at today's visit and you should schedule a follow up appointment with your PCP to have this addressed in the next 7-10 days.

## 2013-07-24 LAB — CERVICOVAGINAL ANCILLARY ONLY
Chlamydia: NEGATIVE
Neisseria Gonorrhea: NEGATIVE
WET PREP (BD AFFIRM): NEGATIVE
WET PREP (BD AFFIRM): POSITIVE — AB
Wet Prep (BD Affirm): NEGATIVE

## 2013-07-24 MED ORDER — METRONIDAZOLE 500 MG PO TABS: 500.0000 mg | ORAL_TABLET | Freq: Two times a day (BID) | ORAL | Status: DC

## 2013-07-24 NOTE — ED Notes (Signed)
Gc/Chlamydia neg., Affirm: Candida and Trich neg., Gardnerella pos., Urine culture: Pending.  Message sent to [facility] Presson PA. Vassie MoselleYork, Timouthy Gilardi M 07/24/2013

## 2013-07-25 ENCOUNTER — Telehealth (HOSPITAL_COMMUNITY): Payer: Self-pay | Admitting: *Deleted

## 2013-07-25 LAB — URINE CULTURE
Colony Count: >=100000
SPECIAL REQUESTS: NORMAL

## 2013-07-25 MED ORDER — CEPHALEXIN 500 MG PO CAPS: 500.0000 mg | ORAL_CAPSULE | Freq: Four times a day (QID) | ORAL | Status: DC

## 2013-07-25 NOTE — ED Notes (Signed)
Dr. Artis FlockKindl e-prescribed Keflex, for the UTI- in case pt. calls back this evening.  Pt. called back while I was in clinical area.  I called her back.  Pt. verified x 2 and given results.  Pt. told she needs Flagyl for bacterial vaginosis and Keflex for a UTI.  Pt. told where to pick up her Rx.'s.   Pt. instructed to no alcohol while taking the Flagyl.  Come back, if not better, after finishing her medication. Vassie MoselleYork, Morgon Pamer M 07/25/2013

## 2013-07-25 NOTE — ED Notes (Addendum)
Narda BondsLee Presson PA e-prescribed Flagyl. I called pt. and left a message to call.  Call 1. Urine culture: >100,000 colonies E. Coli.  Message sent to [facility] Presson PA. Vassie MoselleYork, Tamar Lipscomb M 07/25/2013

## 2013-11-24 ENCOUNTER — Emergency Department (HOSPITAL_COMMUNITY): Payer: 59

## 2013-11-24 ENCOUNTER — Encounter (HOSPITAL_COMMUNITY): Payer: Self-pay | Admitting: Emergency Medicine

## 2013-11-24 ENCOUNTER — Emergency Department (HOSPITAL_COMMUNITY)
Admission: EM | Admit: 2013-11-24 | Discharge: 2013-11-24 | Disposition: A | Discharge status: Home / Self Care | Payer: 59 | Attending: Emergency Medicine | Admitting: Emergency Medicine

## 2013-11-24 DIAGNOSIS — N390 Urinary tract infection, site not specified: Secondary | ICD-10-CM | POA: Diagnosis not present

## 2013-11-24 DIAGNOSIS — K5900 Constipation, unspecified: Secondary | ICD-10-CM

## 2013-11-24 DIAGNOSIS — Z79899 Other long term (current) drug therapy: Secondary | ICD-10-CM | POA: Diagnosis not present

## 2013-11-24 DIAGNOSIS — Z90710 Acquired absence of both cervix and uterus: Secondary | ICD-10-CM | POA: Diagnosis not present

## 2013-11-24 DIAGNOSIS — Z792 Long term (current) use of antibiotics: Secondary | ICD-10-CM | POA: Diagnosis not present

## 2013-11-24 DIAGNOSIS — N760 Acute vaginitis: Secondary | ICD-10-CM | POA: Diagnosis not present

## 2013-11-24 DIAGNOSIS — M549 Dorsalgia, unspecified: Secondary | ICD-10-CM | POA: Insufficient documentation

## 2013-11-24 DIAGNOSIS — Z3202 Encounter for pregnancy test, result negative: Secondary | ICD-10-CM | POA: Diagnosis not present

## 2013-11-24 LAB — CBC WITH DIFFERENTIAL/PLATELET
BASOS ABS: 0 10*3/uL (ref 0.0–0.1)
Basophils Relative: 0 % (ref 0–1)
EOS ABS: 0.1 10*3/uL (ref 0.0–0.7)
EOS PCT: 1 % (ref 0–5)
HCT: 43.4 % (ref 36.0–46.0)
Hemoglobin: 14.3 g/dL (ref 12.0–15.0)
Lymphocytes Relative: 29 % (ref 12–46)
Lymphs Abs: 1.7 10*3/uL (ref 0.7–4.0)
MCH: 30.8 pg (ref 26.0–34.0)
MCHC: 32.9 g/dL (ref 30.0–36.0)
MCV: 93.5 fL (ref 78.0–100.0)
Monocytes Absolute: 0.5 10*3/uL (ref 0.1–1.0)
Monocytes Relative: 8 % (ref 3–12)
Neutro Abs: 3.7 10*3/uL (ref 1.7–7.7)
Neutrophils Relative %: 62 % (ref 43–77)
PLATELETS: 255 10*3/uL (ref 150–400)
RBC: 4.64 MIL/uL (ref 3.87–5.11)
RDW: 12.8 % (ref 11.5–15.5)
WBC: 6 10*3/uL (ref 4.0–10.5)

## 2013-11-24 LAB — COMPREHENSIVE METABOLIC PANEL
ALBUMIN: 3.4 g/dL — AB (ref 3.5–5.2)
ALK PHOS: 60 U/L (ref 39–117)
ALT: 10 U/L (ref 0–35)
AST: 18 U/L (ref 0–37)
Anion gap: 11 (ref 5–15)
BUN: 10 mg/dL (ref 6–23)
CALCIUM: 8.5 mg/dL (ref 8.4–10.5)
CO2: 24 mEq/L (ref 19–32)
Chloride: 106 mEq/L (ref 96–112)
Creatinine, Ser: 0.91 mg/dL (ref 0.50–1.10)
GFR calc Af Amer: 88 mL/min — ABNORMAL LOW (ref >90)
GFR calc non Af Amer: 76 mL/min — ABNORMAL LOW (ref >90)
Glucose, Bld: 88 mg/dL (ref 70–99)
Potassium: 4.6 mEq/L (ref 3.7–5.3)
SODIUM: 141 meq/L (ref 137–147)
TOTAL PROTEIN: 6.8 g/dL (ref 6.0–8.3)
Total Bilirubin: 0.3 mg/dL (ref 0.3–1.2)

## 2013-11-24 LAB — URINALYSIS, ROUTINE W REFLEX MICROSCOPIC
Bilirubin Urine: NEGATIVE
GLUCOSE, UA: NEGATIVE mg/dL
HGB URINE DIPSTICK: NEGATIVE
Ketones, ur: NEGATIVE mg/dL
LEUKOCYTES UA: NEGATIVE
Nitrite: NEGATIVE
PH: 6.5 (ref 5.0–8.0)
PROTEIN: NEGATIVE mg/dL
SPECIFIC GRAVITY, URINE: 1.029 (ref 1.005–1.030)
Urobilinogen, UA: 1 mg/dL (ref 0.0–1.0)

## 2013-11-24 LAB — PREGNANCY, URINE: Preg Test, Ur: NEGATIVE

## 2013-11-24 MED ORDER — MILK AND MOLASSES ENEMA
1.0000 | Freq: Once | RECTAL | Status: DC
  Filled 2013-11-24: qty 250

## 2013-11-24 MED ORDER — ONDANSETRON 4 MG PO TBDP
4.0000 mg | ORAL_TABLET | Freq: Once | ORAL | Status: AC
  Administered 2013-11-24: 4 mg via ORAL
  Filled 2013-11-24: qty 1

## 2013-11-24 MED ORDER — POLYETHYLENE GLYCOL 3350 17 GM/SCOOP PO POWD: 17.0000 g | Freq: Every day | ORAL | Status: DC

## 2013-11-24 MED ORDER — POLYETHYLENE GLYCOL 3350 17 G PO PACK
17.0000 g | PACK | Freq: Every day | ORAL | Status: DC
  Administered 2013-11-24: 17 g via ORAL
  Filled 2013-11-24: qty 1

## 2013-11-24 MED ORDER — MILK AND MOLASSES ENEMA
1.0000 | Freq: Once | RECTAL | Status: AC
  Administered 2013-11-24: 250 mL via RECTAL
  Filled 2013-11-24: qty 250

## 2013-11-24 NOTE — Discharge Instructions (Signed)
Call for a follow up appointment with a Family or Primary Care Provider.  °Return if Symptoms worsen.   °Take medication as prescribed.  ° ° °Emergency Department Resource Guide °1) Find a Doctor and Pay Out of Pocket °Although you won't have to find out who is covered by your insurance plan, it is a good idea to ask around and get recommendations. You will then need to call the office and see if the doctor you have chosen will accept you as a new patient and what types of options they offer for patients who are self-pay. Some doctors offer discounts or will set up payment plans for their patients who do not have insurance, but you will need to ask so you aren't surprised when you get to your appointment. ° °2) Contact Your Local Health Department °Not all health departments have doctors that can see patients for sick visits, but many do, so it is worth a call to see if yours does. If you don't know where your local health department is, you can check in your phone book. The CDC also has a tool to help you locate your state's health department, and many state websites also have listings of all of their local health departments. ° °3) Find a Walk-in Clinic °If your illness is not likely to be very severe or complicated, you may want to try a walk in clinic. These are popping up all over the country in pharmacies, drugstores, and shopping centers. They're usually staffed by nurse practitioners or physician assistants that have been trained to treat common illnesses and complaints. They're usually fairly quick and inexpensive. However, if you have serious medical issues or chronic medical problems, these are probably not your best option. ° °No Primary Care Doctor: °- Call [facility] at  832-8000 - they can help you locate a primary care doctor that  accepts your insurance, provides certain services, etc. °- Physician Referral Service- 1-800-533-3463 ° °Chronic Pain Problems: °Organization         Address  Phone    Notes  °[facility]  (336) 297-2271 Patients need to be referred by their primary care doctor.  ° °Medication Assistance: °Organization         Address  Phone   Notes  °[place] [facility] [address] °[place], [place] [address] (336) 641-8030 --Must be a resident of [place] °-- Must have NO insurance coverage whatsoever (no Medicaid/ Medicare, etc.) °-- The pt. MUST have a primary care doctor that directs their care regularly and follows them in the community °  °[facility]  (866) 331-1348   °[employer]  (888) 892-1162   ° °Agencies that provide inexpensive medical care: °Organization         Address  Phone   Notes  °[facility]  (336) 832-8035   °[facility]    (336) 832-7272   °[facility] [address] °[place], [place] [address] (336) 832-4777   °[facility] [address], °[place] (336) 271-4999   °[facility]    (336) 373-0678   °[facility]    (336) 272-1050   °[facility] ° [address], [place] Phone:  (336) 832-4444, Fax:  (336) 832-4440 Hours of Operation:  9 am - 6 pm, M-F.  Also accepts Medicaid/Medicare and self-pay.  ° [facility] ° [address], [place]   Phone: (336) 832-3150, Fax: (336) 832-3151. Hours of Operation:  8:30 am - 5:30 pm, M-F.  Also accepts Medicaid and self-pay.  °[facility] [place] [address], [place] Phone: (336) 878-6027   °[facility] [address], [place], [place] (336)723-1848, Ext. 123 Mondays & Thursdays: 7-9 AM.  First 15 patients are seen on a first come, first serve basis. °  ° °Medicaid-accepting [place] Providers: ° °Organization         Address  Phone   Notes  °[facility] [address], [place] (336) 641-2100 Also accepts self-pay patients.  °[facility]  [address], [place] ° (336) 856-9996   °[facility] [address], [place] (336) 288-8857   °[facility] [address] [place] [address], [place] (336) 299-7000   °Veita Bland [address], [place]  ° (336) 373-1557 Only accepts [place] Access Medicaid patients after they have their name applied to their card.  ° °Self-Pay (no insurance) in [place]: ° °Organization         Address  Phone   Notes  °Sickle Cell Patients, [facility] [address], [place] (336) 832-1970   °[facility] [address], [place] (336) 832-4400   °[facility] ° [address], [place] (336) 992-4800   °[facility]/Dr. Osei-Bonsu ° [address] [place] [address], [place] or [address], [place] (336) 841-8500 Phone number for both [place] and [place] locations is the same.  °[facility] [address], [place] (336) 299-0000   °[facility] [address] [place] [address], [place] or [address] (336) 852-7530 °(336) 878-2260   °[facility] [address], [place] (336) 350-1642, phone; (336) 294-5005, fax Sees patients 1st and 3rd Saturday of every month.  Must not qualify for public or private insurance (i.e. Medicaid, Medicare, [place] Health Choice, Veterans' Benefits) • Household income should be no more than 200% of the poverty level •The clinic cannot treat you if you are pregnant or think you are pregnant • Sexually transmitted diseases are not treated at the clinic.  ° ° °Dental Care: °Organization         Address  Phone  Notes  °[facility] [facility] [address], [place] (336) 641-6152 Accepts children up to age 21 who are enrolled in Medicaid or [place] Health Choice; pregnant women with a Medicaid card; and children who have  applied for Medicaid or [place] Health Choice, but were declined, whose parents can pay a reduced fee at time of service.  °[facility] [place]  [address], [place] (336) 641-7733 Accepts children up to age 21 who are enrolled in Medicaid or [place] Health Choice; pregnant women with a Medicaid card; and children who have applied for Medicaid or [place] Health Choice, but were declined, whose parents can pay a reduced fee at time of service.  °[facility] ° [address],  (336) 641-4533 Patients are seen by appointment only. Walk-ins are not accepted. [facility] will see patients 18 years of age and older. °Monday - Tuesday (8am-5pm) °Most Wednesdays (8:30-5pm) °$30 per visit, cash only  °[facility] ° [address], [place] (336) 641-4533 Patients are seen by appointment only. Walk-ins are not accepted. [facility] will see patients 18 years of age and older. °One Wednesday Evening (Monthly: Volunteer Based).  $30 per visit, cash only  °[facility]  (919) 537-3737 for adults; Children under age 4, call Graduate Pediatric Dentistry at (919) [phone]. Children aged 4-14, please call (919) [phone] to request a pediatric application. ° Dental services are provided in all areas of dental care including fillings, crowns and bridges, complete and partial dentures, implants, gum treatment, root canals, and extractions. Preventive care is also provided. Treatment is provided to both adults and children. °Patients are selected via a lottery and there is often a waiting list. °  °[facility] [address], °[place] ° (336) 763-8833 [email or web address] °  °[facility] [address], [place], [place] (336)723-1848, Ext. 123 Second and Fourth Thursday of each month, opens at 6:30 AM; Clinic ends at 9 AM.  Patients are seen on a first-come first-served basis, and a  limited number are seen during each clinic.  ° °[facility] ° [address], [place], [place] (336) 723-7904   Eligibility Requirements °You must have lived in [place], [place], or [place] counties for at least the last three months. °  You cannot be eligible for state or federal sponsored healthcare insurance, including Veterans Administration, Medicaid, or Medicare. °  You generally cannot be eligible for healthcare insurance through your employer.  °  How to apply: °Eligibility screenings are held every Tuesday and Wednesday afternoon from 1:00 pm until 4:00 pm. You do not need an appointment for the interview!  °[facility] [address], [place], [place] 336-631-2330   °[facility]  336-342-8273   °[facility]  336-703-3100   °[facility]  336-570-6415   ° °Behavioral Health Resources in the Community: °Intensive Outpatient Programs °Organization         Address  Phone  Notes  °[facility] [address], [place], [place] 336-878-6098   °[facility] [address], [place], [place] 336-832-9800   °[facility] [address], [place], [place] ° 336-882-2125   °[facility] [address],  °[place], [place] 1-800-853-5163 or 336-641-4981   °Substance Abuse Resources °Organization         Address  Phone  Notes  °[facility]  336-882-2125   °[facility]  336-784-9470   °[facility]  336-285-9073   °[facility]  336-845-3988   °Residential & Outpatient Substance Abuse Program  1-800-659-3381   °Psychological Services °Organization         Address  Phone  Notes  ° Health  336- 832-9600   °[facility]  336- 378-7881   °[facility] [address], [place] 1-800-853-5163 or 336-641-4981   ° °Mobile Crisis Teams °Organization          Address  Phone  Notes  °[facility], Mobile Crisis Care Unit  1-877-626-1772   °Assertive °[facility] ° [address] [place], [place] 336-834-9664   °Sharon DeEsch [address] °[place] 336-554-5454   ° °Self-Help/Support Groups °Organization         Address  Phone             Notes  °[facility] - variety of   support groups  336- 373-1402 Call for more information  °Narcotics Anonymous (NA), [facility] [address], °[place]  2 meetings at this location  ° °Residential Treatment Programs °Organization         Address  Phone  Notes  °[facility] [address],    °[place]  1-866-801-8205   °[facility] ° [address], [place], [place] 704-293-8524   °[facility] [address], [place] 336-845-3988 Admissions: 8am-3pm M-F  °[facility] [address]    °[place], [place] 336-841-1104   °[facility] [address], [place], [place] 336-379-7146   °[facility] [address]  °[place], [place] 336-285-9073   °[facility] - Intensive Outpatient [address], [place], [place] 336-852-3033   °[facility] [address]  °[place], [place] 1-877-615-2722 or 336-784-9470   °[facility] [address] [place], [place] 336-227-7417 Accepts Medicaid  °[facility] [address]  °[place] 1-800-659-3381 Substance Abuse/Addiction Treatment  ° °[place] Behavioral Health Resources °Organization         Address  Phone  Notes  °[facility]  (888) 581-9988   °Julie Brannon, PhD [address] [place], [place]   (336) 349-5553 or (336) 951-0000   °[facility]   [address] °[place], [place] (336) 349-4454   °[facility] [address], [place], [place] (336) 342-8316 Insurance/Medicaid/sponsorship  through [facility]  °[facility] [address]                                    [place], [place] (336) 342-8316 Therapy/tele-psych/case  °[facility] [address]  ° [place], [place] (336) 349-2233    °Dr. Arfeen  (336) 349-4544   °[facility] [place]  [employer] [place] [facility] 1) [address], [place] °2) [address], [place] °3)  [address], [place] (336) 349-3220 °(336) 342-7768 ° °(336) 342-8140   °[place] [facility] (336) 342-1394 or (336) 342-3537 (After Hours)    ° °

## 2013-11-24 NOTE — ED Provider Notes (Signed)
CSN: [id number]     Arrival date & time 11/24/13  1715 History   First MD Initiated Contact with Patient 11/24/13 1821     Chief Complaint  Patient presents with  . Abdominal Pain  . Constipation     (Consider location/radiation/quality/duration/timing/severity/associated sxs/prior Treatment) HPI Comments: Patient is a 43 year old female presents emergency room chief complaint of constipation. The patient reports last bowel movement 2.5 weeks ago.  She reports taking Dulcolax 2.5 weeks ago, reports bloody stools 2.5 weeks ago with straining. Bleeding resolved. She denies vomiting.  She reports associated abdominal cramping and lower back pain. Previous abdominal surgeries include C-section partial hysterectomy.  Patient is a 43 y.o. female presenting with abdominal pain and constipation. The history is provided by the patient. No language interpreter was used.  Abdominal Pain Associated symptoms: constipation   Associated symptoms: no diarrhea, no dysuria, no hematuria and no vomiting   Constipation Associated symptoms: abdominal pain and back pain   Associated symptoms: no diarrhea, no dysuria and no vomiting     Past Medical History  Diagnosis Date  . BV (bacterial vaginosis)   . UTI (lower urinary tract infection)    Past Surgical History  Procedure Laterality Date  . Cesarean section    . Abdominal hysterectomy     History reviewed. No pertinent family history. History  Substance Use Topics  . Smoking status: Never Smoker   . Smokeless tobacco: Not on file  . Alcohol Use: No   OB History   Grav Para Term Preterm Abortions TAB SAB Ect Mult Living                 Review of Systems  Gastrointestinal: Positive for abdominal pain and constipation. Negative for vomiting and diarrhea.  Genitourinary: Negative for dysuria and hematuria.  Musculoskeletal: Positive for back pain.      Allergies  Review of patient's allergies indicates no known allergies.  Home  Medications   Prior to Admission medications   Medication Sig Start Date End Date Taking? Authorizing Provider  ALPRAZolam Prudy Feeler) 0.5 MG tablet Take 0.5 mg by mouth at bedtime as needed for sleep.    Historical Provider, MD  Boric Acid POWD by Does not apply route.    Historical Provider, MD  cephALEXin (KEFLEX) 500 MG capsule Take 1 capsule (500 mg total) by mouth 4 (four) times daily. Take all of medicine and drink lots of fluids 07/25/13   Linna Hoff, MD  clindamycin (CLEOCIN) 150 MG capsule Take 1 capsule (150 mg total) by mouth every 6 (six) hours. 11/30/12   Shon Baton, MD  fluconazole (DIFLUCAN) 100 MG tablet Take 1.5 tablets (150 mg total) by mouth daily. 07/25/12   Jimmie Molly, MD  metroNIDAZOLE (FLAGYL) 500 MG tablet Take 1 tablet (500 mg total) by mouth 2 (two) times daily. 02/22/13   Elson Areas, PA-C  metroNIDAZOLE (FLAGYL) 500 MG tablet Take 1 tablet (500 mg total) by mouth 2 (two) times daily. 05/28/13   Hope Orlene Och, NP  metroNIDAZOLE (FLAGYL) 500 MG tablet Take 1 tablet (500 mg total) by mouth 2 (two) times daily. 07/24/13   Jess Barters H Presson, PA   BP 132/90  Pulse 70  Temp(Src) 98.5 F (36.9 C) (Oral)  Resp 18  SpO2 100% Physical Exam  Nursing note and vitals reviewed. Constitutional: She is oriented to person, place, and time. She appears well-developed and well-nourished. No distress.  HENT:  Head: Normocephalic and atraumatic.  Eyes: EOM are normal. No  scleral icterus.  Cardiovascular: Normal rate and regular rhythm.   Pulmonary/Chest: Effort normal and breath sounds normal. She has no wheezes. She exhibits no tenderness.  Abdominal: Soft. There is tenderness. There is no rebound and no guarding.    Genitourinary:  Moderate amount of soft tan stool in rectal vault. No melena. Chaperone present.   Musculoskeletal: Normal range of motion.  Neurological: She is alert and oriented to person, place, and time.  Skin: Skin is dry.  Psychiatric: She has a  normal mood and affect. Her behavior is normal.    ED Course  Procedures (including critical care time) Labs Review Results for orders placed during the hospital encounter of 11/24/13  CBC WITH DIFFERENTIAL      Result Value Ref Range   WBC 6.0  4.0 - 10.5 K/uL   RBC 4.64  3.87 - 5.11 MIL/uL   Hemoglobin 14.3  12.0 - 15.0 g/dL   HCT 16.143.4  09.636.0 - 04.546.0 %   MCV 93.5  78.0 - 100.0 fL   MCH 30.8  26.0 - 34.0 pg   MCHC 32.9  30.0 - 36.0 g/dL   RDW 40.912.8  81.111.5 - 91.415.5 %   Platelets 255  150 - 400 K/uL   Neutrophils Relative % 62  43 - 77 %   Neutro Abs 3.7  1.7 - 7.7 K/uL   Lymphocytes Relative 29  12 - 46 %   Lymphs Abs 1.7  0.7 - 4.0 K/uL   Monocytes Relative 8  3 - 12 %   Monocytes Absolute 0.5  0.1 - 1.0 K/uL   Eosinophils Relative 1  0 - 5 %   Eosinophils Absolute 0.1  0.0 - 0.7 K/uL   Basophils Relative 0  0 - 1 %   Basophils Absolute 0.0  0.0 - 0.1 K/uL  COMPREHENSIVE METABOLIC PANEL      Result Value Ref Range   Sodium 141  137 - 147 mEq/L   Potassium 4.6  3.7 - 5.3 mEq/L   Chloride 106  96 - 112 mEq/L   CO2 24  19 - 32 mEq/L   Glucose, Bld 88  70 - 99 mg/dL   BUN 10  6 - 23 mg/dL   Creatinine, Ser 7.820.91  0.50 - 1.10 mg/dL   Calcium 8.5  8.4 - 95.610.5 mg/dL   Total Protein 6.8  6.0 - 8.3 g/dL   Albumin 3.4 (*) 3.5 - 5.2 g/dL   AST 18  0 - 37 U/L   ALT 10  0 - 35 U/L   Alkaline Phosphatase 60  39 - 117 U/L   Total Bilirubin 0.3  0.3 - 1.2 mg/dL   GFR calc non Af Amer 76 (*) >90 mL/min   GFR calc Af Amer 88 (*) >90 mL/min   Anion gap 11  5 - 15  PREGNANCY, URINE      Result Value Ref Range   Preg Test, Ur NEGATIVE  NEGATIVE  URINALYSIS, ROUTINE W REFLEX MICROSCOPIC      Result Value Ref Range   Color, Urine YELLOW  YELLOW   APPearance CLEAR  CLEAR   Specific Gravity, Urine 1.029  1.005 - 1.030   pH 6.5  5.0 - 8.0   Glucose, UA NEGATIVE  NEGATIVE mg/dL   Hgb urine dipstick NEGATIVE  NEGATIVE   Bilirubin Urine NEGATIVE  NEGATIVE   Ketones, ur NEGATIVE  NEGATIVE mg/dL    Protein, ur NEGATIVE  NEGATIVE mg/dL   Urobilinogen, UA 1.0  0.0 -  1.0 mg/dL   Nitrite NEGATIVE  NEGATIVE   Leukocytes, UA NEGATIVE  NEGATIVE   Dg Abd 1 View  11/24/2013   CLINICAL DATA:  Abdominal pain, constipation, nausea vomiting and diarrhea for 2 weeks, unable to have bowel movement without laxative, which then causes diarrhea and emesis  EXAM: ABDOMEN - 1 VIEW  COMPARISON:  10/18/2005  FINDINGS: There is stool throughout the entire colon. There are no abnormally dilated loops of bowel. The mid to inferior rectum is excluded from the field of view. There is mild levoscoliosis of the lumbar spine.  IMPRESSION: Constipation   Electronically Signed   By: Esperanza Heir M.D.   On: 11/24/2013 20:07      Imaging Review No results found.   EKG Interpretation None      MDM   Final diagnoses:  Constipation, unspecified constipation type   Patient presents with constipation for 2.5 weeks. Mild abdominal tenderness. Denies vomiting, doubt obstruction. Patient is afebrile, no leukocytosis. CMP without concerning abnormalities. Negative pregnancy, UA without infection. DRE rectal exam shows soft stool in rectal vault. Upright x-ray with constipation. and molasses enema ordered, MiraLAX ordered. Patient care assumed by Tomasa Blase at [facility]. Plan to discharge home, after enema, with MiraLAX and followup with PCP.  Meds given in ED:  Medications  polyethylene glycol (MIRALAX / GLYCOLAX) packet 17 g (not administered)  milk and molasses enema (not administered)  ondansetron (ZOFRAN-ODT) disintegrating tablet 4 mg (not administered)    Discharge Medication List as of 11/24/2013  8:41 PM    START taking these medications   Details  polyethylene glycol powder (GLYCOLAX/MIRALAX) powder Take 17 g by mouth daily. Until daily soft stools  OTC, Starting 11/24/2013, Until Discontinued, Print            Mellody Drown, PA-C 11/26/13 0017

## 2013-11-24 NOTE — ED Notes (Signed)
Pt presents with abd discomfort and lower back pain x1 month, pt denies passing gas or having a BM x10-14 days. Pt states she feels heavy and bloated all the time. Pt states if she takes Dulcolax does pass stool but also vomits.

## 2013-11-24 NOTE — ED Notes (Signed)
Pt had large BM and reports feeling much better at this time.

## 2013-11-24 NOTE — ED Provider Notes (Signed)
Patient had a large, bowel movement, and feels much, better.  She will be discharged per Charlynne CousinsLaura Parker, PA-C previous instructions  Arman FilterGail K Inette Doubrava, NP 11/24/13 2151

## 2013-11-24 NOTE — ED Notes (Signed)
Pt comfortable with discharge and follow up instructions. Prescriptions x1. Pt declines wheelchair, escorted to waiting area. 

## 2013-11-25 NOTE — ED Provider Notes (Signed)
Medical screening examination/treatment/procedure(s) were performed by non-physician practitioner and as supervising physician I was immediately available for consultation/collaboration.   EKG Interpretation None        Lyanne CoKevin M Jatavis Malek, MD 11/25/13 1106

## 2013-11-26 NOTE — ED Provider Notes (Signed)
Medical screening examination/treatment/procedure(s) were performed by non-physician practitioner and as supervising physician I was immediately available for consultation/collaboration.   EKG Interpretation None        Lyanne CoKevin M Kameran Lallier, MD 11/26/13 1109

## 2014-04-09 ENCOUNTER — Other Ambulatory Visit: Payer: Self-pay

## 2014-04-09 DIAGNOSIS — Z1231 Encounter for screening mammogram for malignant neoplasm of breast: Secondary | ICD-10-CM

## 2014-05-10 ENCOUNTER — Ambulatory Visit
Admission: RE | Admit: 2014-05-10 | Discharge: 2014-05-10 | Disposition: A | Discharge status: Home / Self Care | Payer: 59 | Source: Ambulatory Visit

## 2014-05-10 DIAGNOSIS — Z1231 Encounter for screening mammogram for malignant neoplasm of breast: Secondary | ICD-10-CM

## 2014-05-15 ENCOUNTER — Other Ambulatory Visit: Payer: Self-pay | Admitting: *Deleted

## 2014-05-15 DIAGNOSIS — R928 Other abnormal and inconclusive findings on diagnostic imaging of breast: Secondary | ICD-10-CM

## 2014-05-22 ENCOUNTER — Ambulatory Visit
Admission: RE | Admit: 2014-05-22 | Discharge: 2014-05-22 | Disposition: A | Discharge status: Home / Self Care | Payer: 59 | Source: Ambulatory Visit | Attending: *Deleted | Admitting: *Deleted

## 2014-05-22 DIAGNOSIS — R928 Other abnormal and inconclusive findings on diagnostic imaging of breast: Secondary | ICD-10-CM

## 2015-05-08 ENCOUNTER — Other Ambulatory Visit: Payer: Self-pay

## 2015-05-08 DIAGNOSIS — Z1231 Encounter for screening mammogram for malignant neoplasm of breast: Secondary | ICD-10-CM

## 2015-05-17 ENCOUNTER — Ambulatory Visit: Payer: Self-pay

## 2015-05-22 IMAGING — MG MM SCREEN MAMMOGRAM BILATERAL
4 series · 4 of 4 positions shown · non-contrast
Comparison: Previous exam(s).

CLINICAL DATA: Screening.

EXAM:
DIGITAL SCREENING BILATERAL MAMMOGRAM WITH CAD

[R CC]
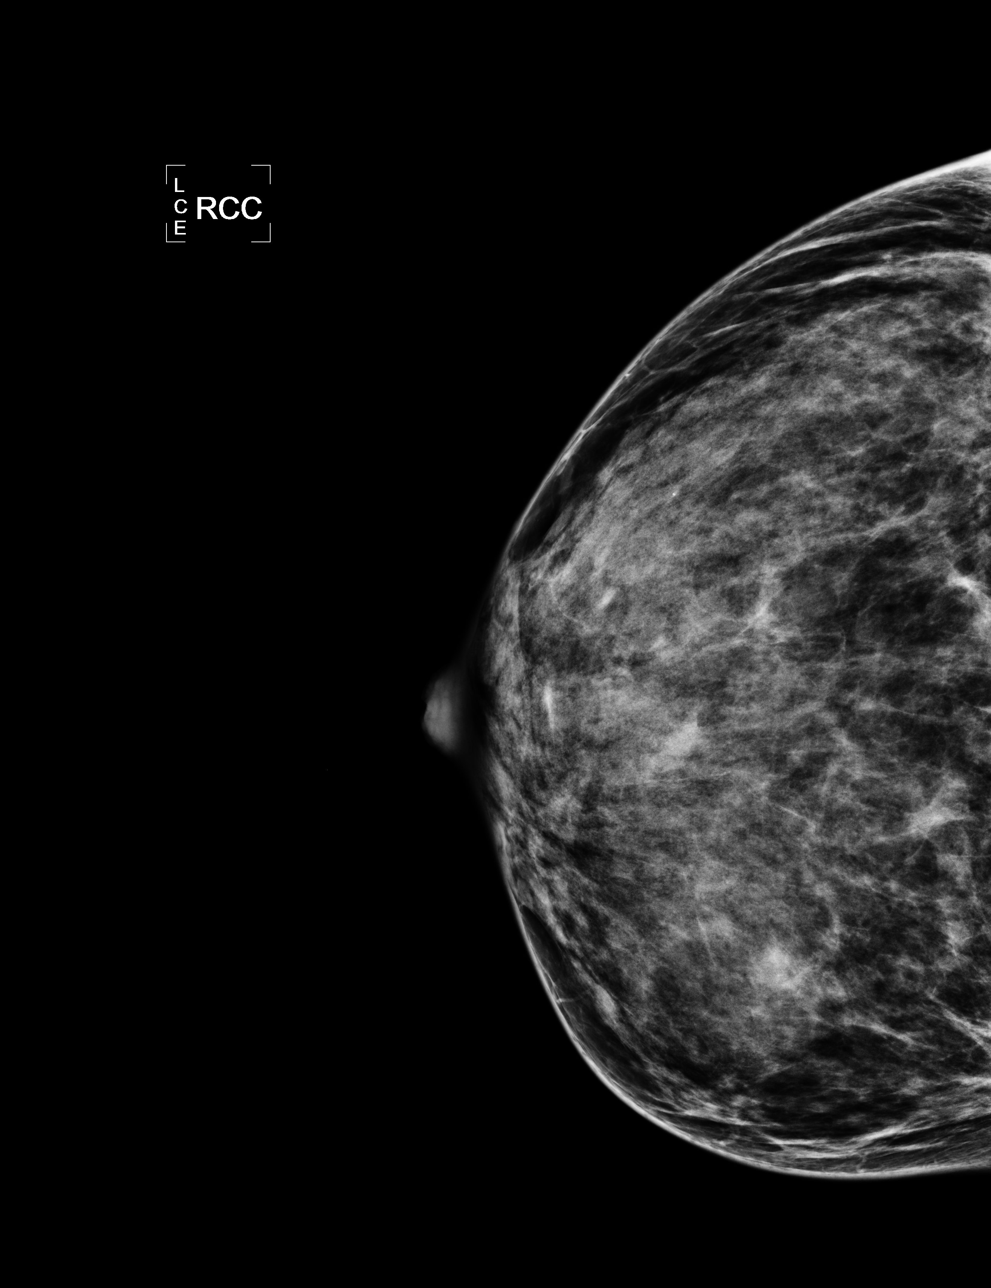

[L CC]
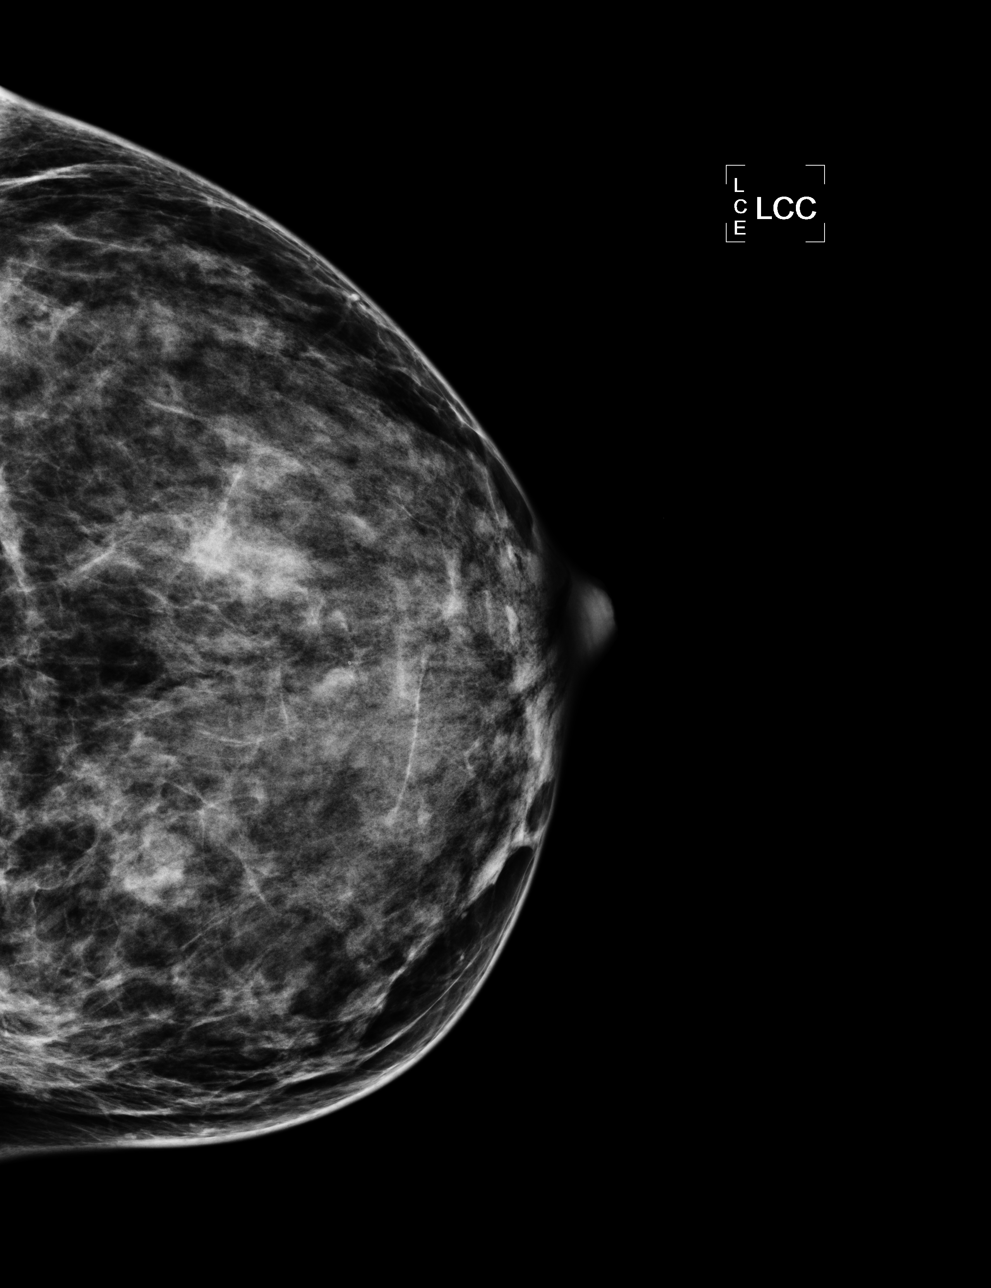

[L MLO]
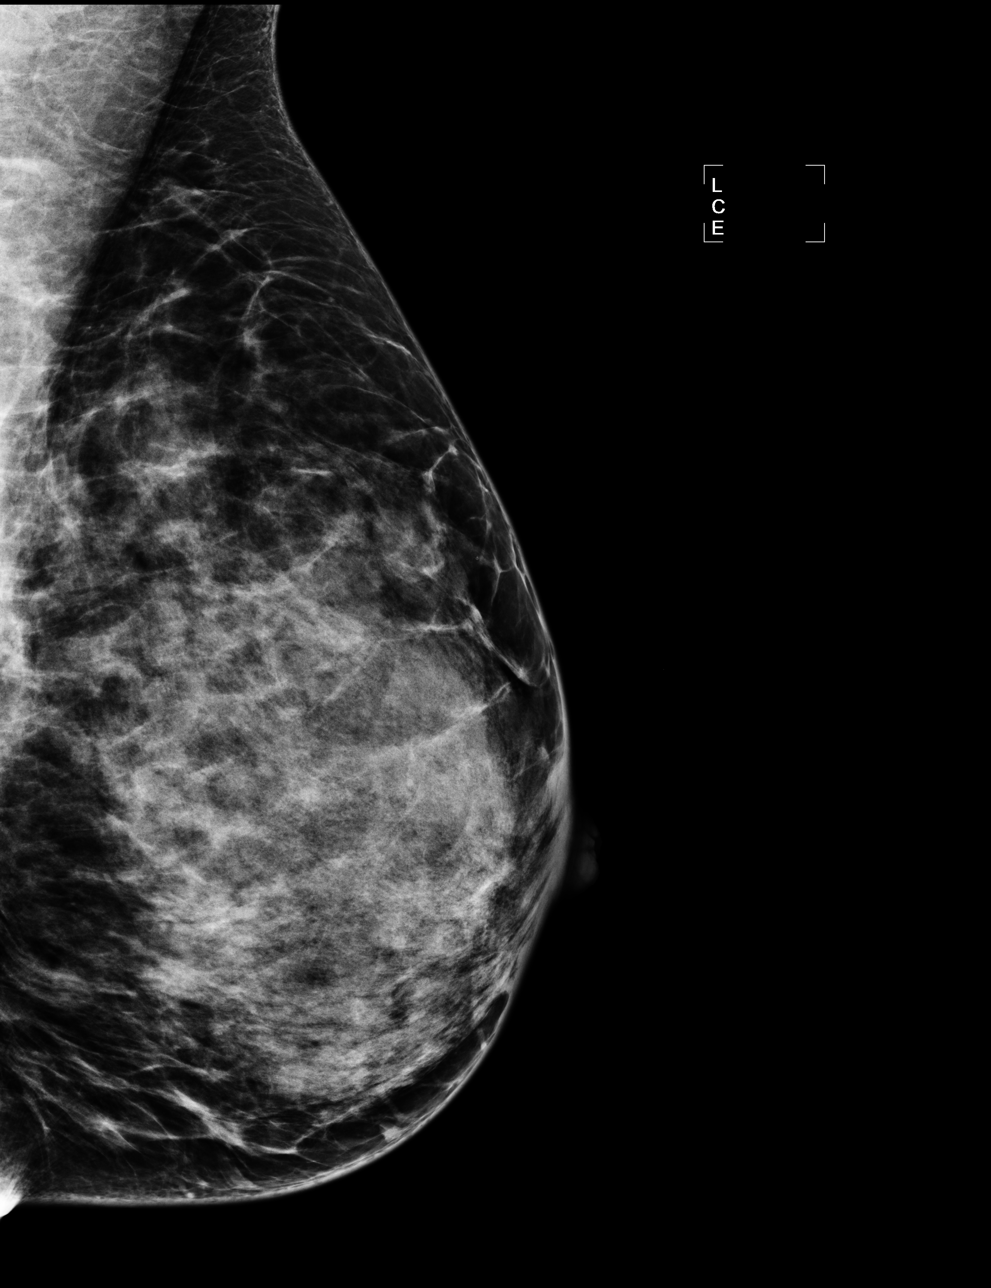

[R MLO]
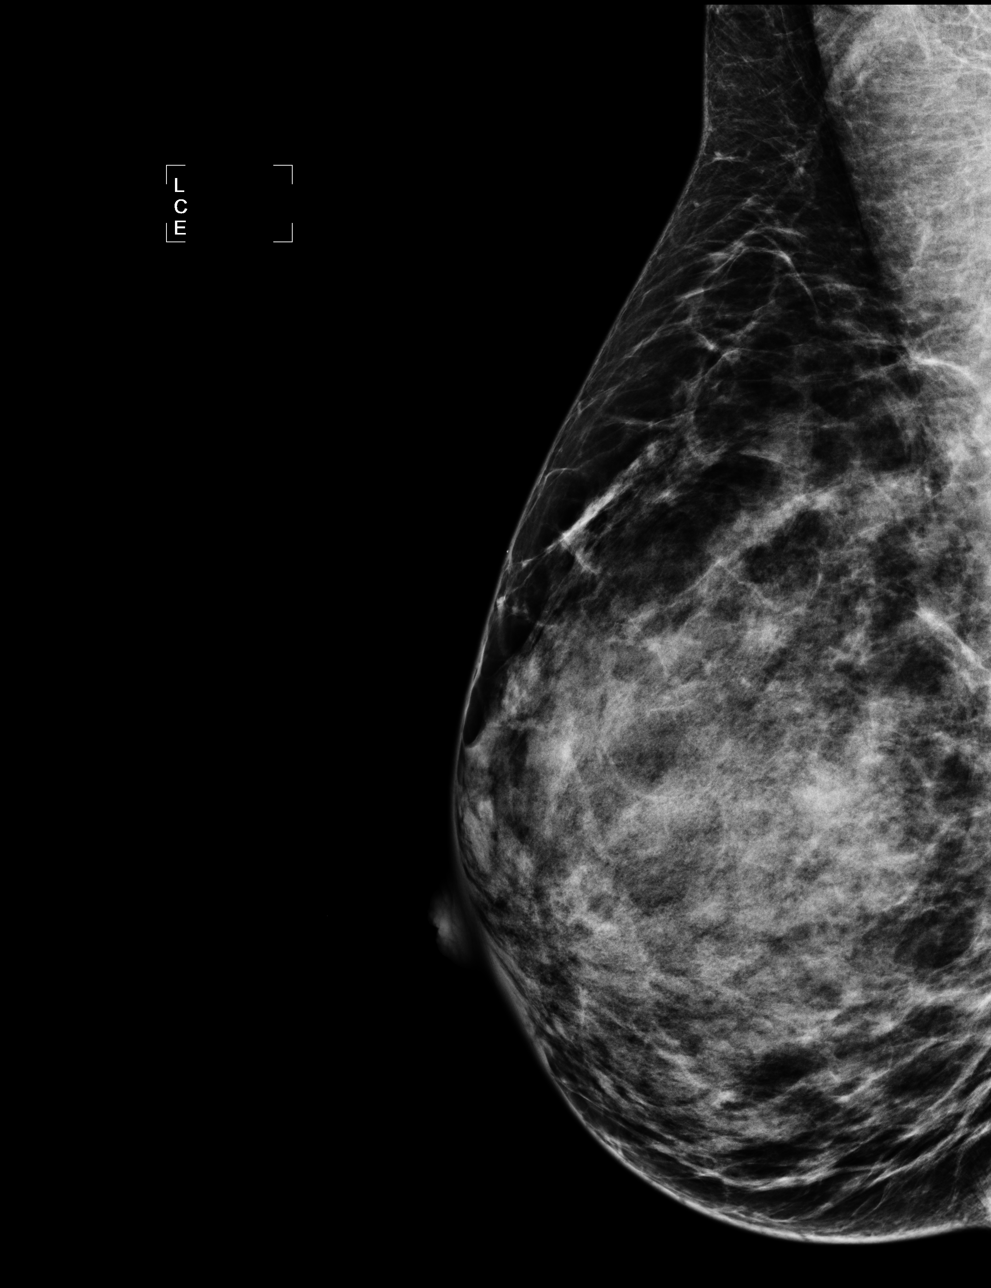

[4 of 4 positions shown; findings below may reference images not displayed]

ACR Breast Density Category d: The breast tissue is extremely dense,
which lowers the sensitivity of mammography.
FINDINGS: There are no findings suspicious for malignancy. Images were
processed with CAD.
IMPRESSION: No mammographic evidence of malignancy. A result letter of this
screening mammogram will be mailed directly to the patient.

RECOMMENDATION:
Screening mammogram in one year. (Code:[id number])

BI-RADS CATEGORY  1: Negative

## 2015-05-24 ENCOUNTER — Ambulatory Visit
Admission: RE | Admit: 2015-05-24 | Discharge: 2015-05-24 | Disposition: A | Discharge status: Home / Self Care | Payer: 59 | Source: Ambulatory Visit

## 2015-05-24 DIAGNOSIS — Z1231 Encounter for screening mammogram for malignant neoplasm of breast: Secondary | ICD-10-CM

## 2015-05-28 ENCOUNTER — Other Ambulatory Visit: Payer: Self-pay | Admitting: *Deleted

## 2015-05-28 DIAGNOSIS — R928 Other abnormal and inconclusive findings on diagnostic imaging of breast: Secondary | ICD-10-CM

## 2015-05-31 ENCOUNTER — Ambulatory Visit
Admission: RE | Admit: 2015-05-31 | Discharge: 2015-05-31 | Disposition: A | Discharge status: Home / Self Care | Payer: 59 | Source: Ambulatory Visit | Attending: *Deleted | Admitting: *Deleted

## 2015-05-31 DIAGNOSIS — R928 Other abnormal and inconclusive findings on diagnostic imaging of breast: Secondary | ICD-10-CM

## 2016-04-29 ENCOUNTER — Other Ambulatory Visit: Payer: Self-pay | Admitting: *Deleted

## 2016-04-29 ENCOUNTER — Other Ambulatory Visit: Payer: Self-pay | Admitting: Orthopaedic Surgery

## 2016-04-29 DIAGNOSIS — M47892 Other spondylosis, cervical region: Secondary | ICD-10-CM

## 2016-04-29 DIAGNOSIS — M542 Cervicalgia: Secondary | ICD-10-CM

## 2016-04-29 DIAGNOSIS — Z1231 Encounter for screening mammogram for malignant neoplasm of breast: Secondary | ICD-10-CM

## 2016-05-03 ENCOUNTER — Ambulatory Visit
Admission: RE | Admit: 2016-05-03 | Discharge: 2016-05-03 | Disposition: A | Discharge status: Home / Self Care | Payer: 59 | Source: Ambulatory Visit | Attending: Orthopaedic Surgery | Admitting: Orthopaedic Surgery

## 2016-05-03 DIAGNOSIS — M47892 Other spondylosis, cervical region: Secondary | ICD-10-CM

## 2016-05-03 DIAGNOSIS — M542 Cervicalgia: Secondary | ICD-10-CM

## 2016-05-26 ENCOUNTER — Ambulatory Visit
Admission: RE | Admit: 2016-05-26 | Discharge: 2016-05-26 | Disposition: A | Discharge status: Home / Self Care | Payer: 59 | Source: Ambulatory Visit | Attending: *Deleted | Admitting: *Deleted

## 2016-05-26 DIAGNOSIS — Z1231 Encounter for screening mammogram for malignant neoplasm of breast: Secondary | ICD-10-CM

## 2017-02-28 ENCOUNTER — Emergency Department (HOSPITAL_COMMUNITY)
Admission: EM | Admit: 2017-02-28 | Discharge: 2017-02-28 | Disposition: A | Discharge status: Home / Self Care | Payer: 59 | Attending: Emergency Medicine | Admitting: Emergency Medicine

## 2017-02-28 ENCOUNTER — Encounter (HOSPITAL_COMMUNITY): Payer: Self-pay | Admitting: Emergency Medicine

## 2017-02-28 DIAGNOSIS — M546 Pain in thoracic spine: Secondary | ICD-10-CM | POA: Insufficient documentation

## 2017-02-28 DIAGNOSIS — M549 Dorsalgia, unspecified: Secondary | ICD-10-CM | POA: Diagnosis present

## 2017-02-28 DIAGNOSIS — G8929 Other chronic pain: Secondary | ICD-10-CM | POA: Insufficient documentation

## 2017-02-28 DIAGNOSIS — M542 Cervicalgia: Secondary | ICD-10-CM | POA: Insufficient documentation

## 2017-02-28 MED ORDER — IBUPROFEN 600 MG PO TABS: 600.0000 mg | ORAL_TABLET | Freq: Four times a day (QID) | ORAL | 0 refills | Status: DC | PRN

## 2017-02-28 MED ORDER — ORPHENADRINE CITRATE ER 100 MG PO TB12: 100.0000 mg | ORAL_TABLET | Freq: Two times a day (BID) | ORAL | 0 refills | Status: DC | PRN

## 2017-02-28 MED ORDER — KETOROLAC TROMETHAMINE 30 MG/ML IJ SOLN
30.0000 mg | Freq: Once | INTRAMUSCULAR | Status: AC
  Administered 2017-02-28: 30 mg via INTRAMUSCULAR
  Filled 2017-02-28: qty 1

## 2017-02-28 MED ORDER — MORPHINE SULFATE (PF) 4 MG/ML IV SOLN
4.0000 mg | Freq: Once | INTRAVENOUS | Status: AC
  Administered 2017-02-28: 4 mg via INTRAMUSCULAR
  Filled 2017-02-28: qty 1

## 2017-02-28 NOTE — Discharge Instructions (Signed)
Medications: Norflex, ibuprofen  Treatment: Take Norflex OR your Flexeril OR baclofen that you have at home.  Take ibuprofen every 6 hours over the next few days.  Use heat and ice alternating 20 minutes on, 20 minutes off.  Attempt the exercises and stretches 1-2 times per day as tolerated.  Follow-up: Please follow-up with your doctor for further management of your pain.  Please return emergency department if you develop any new or worsening symptoms including loss of bowel or bladder control, numbness in your groin, complete numbness of your legs or one leg, or any other new or concerning symptoms.
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
## 2017-05-06 ENCOUNTER — Encounter (INDEPENDENT_AMBULATORY_CARE_PROVIDER_SITE_OTHER): Payer: Self-pay

## 2017-05-06 ENCOUNTER — Ambulatory Visit: Payer: 59 | Admitting: Neurology

## 2017-05-06 ENCOUNTER — Encounter: Payer: Self-pay | Admitting: Neurology

## 2017-05-06 VITALS — BP 124/72 | HR 8 | Ht 66.0 in | Wt 165.0 lb

## 2017-05-06 DIAGNOSIS — G43009 Migraine without aura, not intractable, without status migrainosus: Secondary | ICD-10-CM | POA: Diagnosis not present

## 2017-05-06 MED ORDER — ELETRIPTAN HYDROBROMIDE 40 MG PO TABS: 40.0000 mg | ORAL_TABLET | ORAL | 11 refills | Status: DC | PRN

## 2017-05-06 MED ORDER — ONDANSETRON HCL 4 MG PO TABS: 4.0000 mg | ORAL_TABLET | Freq: Three times a day (TID) | ORAL | 11 refills | Status: DC | PRN

## 2017-05-06 NOTE — Progress Notes (Signed)
[facility]    Provider:  Dr Lucia GaskinsAhern Referring Provider: Marva PandaMillsaps, Kimberly, NP Primary Care Physician:  Marva PandaMillsaps, Kimberly, NP  CC:  Migraine  HPI:  Barbara MilletLatarsha Rasmussen is a 47 y.o. female here as a referral from Dr. Fredrik CoveMillsaps for migraines.  She has a past medical history of chronic back and neck pain after an accident. Migraines started about a year ago. She thinks maybe the back pain has made it worse. No Fhx migraines. Her headaches are pounding, on the right side of the head in the back, she has light sensitivity, xanax helps her sleep which helps. Rizatriptan didn't work. She will have a headache every 2-3 weeks, has nausea and vomiting. Unless she goes to sleep could last all day. Can last all day if untreated. She denies medication. Her neck hurts with the headaches, her shoulders get tight. She can wakeup with it in the morning. Vision is worsening.   Reviewed notes, labs and imaging from outside physicians, which showed:  MRi brain report: No acute intracranial abnormality. . Multiple small discrete white matter lesions are identified. These lesions are abnormal but nonspecific, usually resulting from the benign/remote/incidental causes (e.g. prior trauma/inflammation/demyelination, or chronic ischemia associated with migraine/atherosclerosis/other vasculopathies).She was seen in the emergency room in November 2018 due to her ongoing neck and back pain and chronic pain.  She had been taking Flexeril, Tylenol and baclofen.  MRI of the neck was completed.  Also MRI of the thoracic spine was completed she did not need surgery.  She has intermittent tingling down her right leg.  No saddle anesthesia, bowel or bladder, incontinence.  Neurology exam was unremarkable.  She is on ibuprofen, Norflex for her pain.  Also reviewed referring physician notes, she was seen there for multiple issues including her back pain.  Appears as though she is tried Flexeril, Maxalt for her migraines,  Zofran for her migraines prednisone for unknown causes.  She reported headaches associated with nausea vomiting for many years.  For the past year having worsening headaches, starting with a hot flash turns into a severe headache associated with nausea vomiting and diarrhea.  She feels off balance during the headaches, light and sound sensitivity, typically headaches are self-limiting and resolve with sleep.  Now more frequent and symptoms seem to be progressing.  Review of Systems: Patient complains of symptoms per HPI as well as the following symptoms: headace. Pertinent negatives and positives per HPI. All others negative.   Social History   Socioeconomic History  . Marital status: Single    Spouse name: Not on file  . Number of children: Not on file  . Years of education: Not on file  . Highest education level: Not on file  Social Needs  . Financial resource strain: Not on file  . Food insecurity - worry: Not on file  . Food insecurity - inability: Not on file  . Transportation needs - medical: Not on file  . Transportation needs - non-medical: Not on file  Occupational History  . Not on file  Tobacco Use  . Smoking status: Never Smoker  . Smokeless tobacco: Never Used  Substance and Sexual Activity  . Alcohol use: No  . Drug use: No    Comment: usually uses condom  . Sexual activity: Yes    Birth control/protection: Condom  Other Topics Concern  . Not on file  Social History Narrative  . Not on file    No family history on file.  Past Medical History:  Diagnosis Date  .  BV (bacterial vaginosis)   . UTI (lower urinary tract infection)     Past Surgical History:  Procedure Laterality Date  . ABDOMINAL HYSTERECTOMY    . CESAREAN SECTION      Current Outpatient Medications  Medication Sig Dispense Refill  . ALPRAZolam (XANAX) 1 MG tablet Take 1 mg by mouth at bedtime as needed for sleep.    Marland Kitchen ibuprofen (ADVIL,MOTRIN) 600 MG tablet Take 1 tablet (600 mg total)  every 6 (six) hours as needed by mouth. 30 tablet 0  . orphenadrine (NORFLEX) 100 MG tablet Take 1 tablet (100 mg total) 2 (two) times daily as needed by mouth for muscle spasms. 20 tablet 0   No current facility-administered medications for this visit.     Allergies as of 05/06/2017 - Review Complete 05/06/2017  Allergen Reaction Noted  . Penicillins  05/06/2017    Vitals: BP 124/72   Pulse (!) 8   Ht 5\' 6"  (1.676 m)   Wt 165 lb (74.8 kg)   BMI 26.63 kg/m  Last Weight:  Wt Readings from Last 1 Encounters:  05/06/17 165 lb (74.8 kg)   Last Height:   Ht Readings from Last 1 Encounters:  05/06/17 5\' 6"  (1.676 m)   Physical exam: Exam: Gen: NAD, conversant, well nourised, well groomed                     CV: RRR, no MRG. No Carotid Bruits. No peripheral edema, warm, nontender Eyes: Conjunctivae clear without exudates or hemorrhage  Neuro: Detailed Neurologic Exam  Speech:    Speech is normal; fluent and spontaneous with normal comprehension.  Cognition:    The patient is oriented to person, place, and time;     recent and remote memory intact;     language fluent;     normal attention, concentration,     fund of knowledge Cranial Nerves:    The pupils are equal, round, and reactive to light. The fundi are normal and spontaneous venous pulsations are present. Visual fields are full to finger confrontation. Extraocular movements are intact. Trigeminal sensation is intact and the muscles of mastication are normal. The face is symmetric. The palate elevates in the midline. Hearing intact. Voice is normal. Shoulder shrug is normal. The tongue has normal motion without fasciculations.   Coordination:    Normal finger to nose and heel to shin. Normal rapid alternating movements.   Gait:    Heel-toe and tandem gait are normal.   Motor Observation:    No asymmetry, no atrophy, and no involuntary movements noted. Tone:    Normal muscle tone.    Posture:    Posture is  normal. normal erect    Strength:    Strength is V/V in the upper and lower limbs.      Sensation: intact to LT     Reflex Exam:  DTR's:    Deep tendon reflexes in the upper and lower extremities are normal bilaterally.   Toes:    The toes are downgoing bilaterally.   Clonus:    Clonus is absent.       Assessment/Plan:  47 year old with migraine without aura   Consider trying: Magnesium citrate 400mg  to 600mg  daily, riboflavin 400mg , Coenzyme Q 10 100mg  three times daily  Consider joining our Facebook group [facility].  Eletriptan: Please take one tablet at the onset of your headache. If it does not improve the symptoms please take one additional tablet in 2  hours. Do not take more then 2 tablets in 24hrs. Do not take use more then 2 to 3 times in a week.  Take with Ondansetron and an alleve or ibuprofen or tylenol. Take IMMEDIATELY at onset of head pain.  Discussed:To prevent or relieve headaches, try the following: Cool Compress. Lie down and place a cool compress on your head.  Avoid headache triggers. If certain foods or odors seem to have triggered your migraines in the past, avoid them. A headache diary might help you identify triggers.  Include physical activity in your daily routine. Try a daily walk or other moderate aerobic exercise.  Manage stress. Find healthy ways to cope with the stressors, such as delegating tasks on your to-do list.  Practice relaxation techniques. Try deep breathing, yoga, massage and visualization.  Eat regularly. Eating regularly scheduled meals and maintaining a healthy diet might help prevent headaches. Also, drink plenty of fluids.  Follow a regular sleep schedule. Sleep deprivation might contribute to headaches Consider biofeedback. With this mind-body technique, you learn to control certain bodily functions - such as muscle tension, heart rate and blood pressure - to prevent headaches or reduce headache pain.     Proceed to emergency room if you experience new or worsening symptoms or symptoms do not resolve, if you have new neurologic symptoms or if headache is severe, or for any concerning symptom.   Provided education and documentation from American headache Society toolbox including articles on: chronic migraine medication overuse headache, chronic migraines, prevention of migraines, behavioral and other nonpharmacologic treatments for headache.   Naomie Dean, MD  [facility] [address] [place], [place] [address]  Phone [phone] Fax [phone]

## 2017-05-06 NOTE — Patient Instructions (Addendum)
Consider trying: Magnesium citrate 400mg  to 600mg  daily, riboflavin 400mg , Coenzyme Q 10 100mg  three times daily  Consider joining our Facebook group [facility].  Eletriptan: Please take one tablet at the onset of your headache. If it does not improve the symptoms please take one additional tablet in 2 hours. Do not take more then 2 tablets in 24hrs. Do not take use more then 2 to 3 times in a week.  Take with Ondansetron and an alleve or ibuprofen or tylenol. Take IMMEDIATELY at onset of head pain.   Migraine Headache A migraine headache is an intense, throbbing pain on one side or both sides of the head. Migraines may also cause other symptoms, such as nausea, vomiting, and sensitivity to light and noise. What are the causes? Doing or taking certain things may also trigger migraines, such as:  Alcohol.  Smoking.  Medicines, such as: ? Medicine used to treat chest pain (nitroglycerine). ? Birth control pills. ? Estrogen pills. ? Certain blood pressure medicines.  Aged cheeses, chocolate, or caffeine.  Foods or drinks that contain nitrates, glutamate, aspartame, or tyramine.  Physical activity.  Other things that may trigger a migraine include:  Menstruation.  Pregnancy.  Hunger.  Stress, lack of sleep, too much sleep, or fatigue.  Weather changes.  What increases the risk? The following factors may make you more likely to experience migraine headaches:  Age. Risk increases with age.  Family history of migraine headaches.  Being Caucasian.  Depression and anxiety.  Obesity.  Being a woman.  Having a hole in the heart (patent foramen ovale) or other heart problems.  What are the signs or symptoms? The main symptom of this condition is pulsating or throbbing pain. Pain may:  Happen in any area of the head, such as on one side or both sides.  Interfere with daily activities.  Get worse with physical activity.  Get worse with exposure  to bright lights or loud noises.  Other symptoms may include:  Nausea.  Vomiting.  Dizziness.  General sensitivity to bright lights, loud noises, or smells.  Before you get a migraine, you may get warning signs that a migraine is developing (aura). An aura may include:  Seeing flashing lights or having blind spots.  Seeing bright spots, halos, or zigzag lines.  Having tunnel vision or blurred vision.  Having numbness or a tingling feeling.  Having trouble talking.  Having muscle weakness.  How is this diagnosed? A migraine headache can be diagnosed based on:  Your symptoms.  A physical exam.  Tests, such as CT scan or MRI of the head. These imaging tests can help rule out other causes of headaches.  Taking fluid from the spine (lumbar puncture) and analyzing it (cerebrospinal fluid analysis, or CSF analysis).  How is this treated? A migraine headache is usually treated with medicines that:  Relieve pain.  Relieve nausea.  Prevent migraines from coming back.  Treatment may also include:  Acupuncture.  Lifestyle changes like avoiding foods that trigger migraines.  Follow these instructions at home: Medicines  Take over-the-counter and prescription medicines only as told by your health care provider.  Do not drive or use heavy machinery while taking prescription pain medicine.  To prevent or treat constipation while you are taking prescription pain medicine, your health care provider may recommend that you: ? Drink enough fluid to keep your urine clear or pale yellow. ? Take over-the-counter or prescription medicines. ? Eat foods that are high in fiber, such  as fresh fruits and vegetables, whole grains, and beans. ? Limit foods that are high in fat and processed sugars, such as fried and sweet foods. Lifestyle  Avoid alcohol use.  Do not use any products that contain nicotine or tobacco, such as cigarettes and e-cigarettes. If you need help quitting,  ask your health care provider.  Get at least 8 hours of sleep every night.  Limit your stress. General instructions   Keep a journal to find out what may trigger your migraine headaches. For example, write down: ? What you eat and drink. ? How much sleep you get. ? Any change to your diet or medicines.  If you have a migraine: ? Avoid things that make your symptoms worse, such as bright lights. ? It may help to lie down in a dark, quiet room. ? Do not drive or use heavy machinery. ? Ask your health care provider what activities are safe for you while you are experiencing symptoms.  Keep all follow-up visits as told by your health care provider. This is important. Contact a health care provider if:  You develop symptoms that are different or more severe than your usual migraine symptoms. Get help right away if:  Your migraine becomes severe.  You have a fever.  You have a stiff neck.  You have vision loss.  Your muscles feel weak or like you cannot control them.  You start to lose your balance often.  You develop trouble walking.  You faint. This information is not intended to replace advice given to you by your health care provider. Make sure you discuss any questions you have with your health care provider. Document Released: 04/06/2005 Document Revised: 10/25/2015 Document Reviewed: 09/23/2015 Elsevier Interactive Patient Education  2017 [employer].

## 2017-05-07 ENCOUNTER — Telehealth: Payer: Self-pay | Admitting: *Deleted

## 2017-05-07 LAB — TSH: TSH: 0.944 u[IU]/mL (ref 0.450–4.500)

## 2017-05-07 NOTE — Telephone Encounter (Signed)
-----   Message from Anson FretAntonia B Ahern, MD sent at 05/07/2017  9:04 AM EST ----- Thyroid normal

## 2017-05-07 NOTE — Telephone Encounter (Signed)
Called and spoke with patient. She verbalized understanding that her thyroid lab was normal.

## 2017-05-26 ENCOUNTER — Other Ambulatory Visit: Payer: Self-pay | Admitting: *Deleted

## 2017-05-26 DIAGNOSIS — Z139 Encounter for screening, unspecified: Secondary | ICD-10-CM

## 2017-05-27 ENCOUNTER — Ambulatory Visit
Admission: RE | Admit: 2017-05-27 | Discharge: 2017-05-27 | Disposition: A | Discharge status: Home / Self Care | Payer: 59 | Source: Ambulatory Visit | Attending: *Deleted | Admitting: *Deleted

## 2017-05-27 DIAGNOSIS — Z139 Encounter for screening, unspecified: Secondary | ICD-10-CM

## 2018-02-25 ENCOUNTER — Other Ambulatory Visit: Payer: Self-pay | Admitting: Physician Assistant

## 2018-02-25 DIAGNOSIS — N6320 Unspecified lump in the left breast, unspecified quadrant: Secondary | ICD-10-CM

## 2018-03-01 ENCOUNTER — Other Ambulatory Visit: Payer: Self-pay | Admitting: Physician Assistant

## 2018-03-01 ENCOUNTER — Ambulatory Visit
Admission: RE | Admit: 2018-03-01 | Discharge: 2018-03-01 | Disposition: A | Discharge status: Home / Self Care | Payer: 59 | Source: Ambulatory Visit | Attending: Physician Assistant | Admitting: Physician Assistant

## 2018-03-01 DIAGNOSIS — N6320 Unspecified lump in the left breast, unspecified quadrant: Secondary | ICD-10-CM

## 2018-03-02 ENCOUNTER — Ambulatory Visit
Admission: RE | Admit: 2018-03-02 | Discharge: 2018-03-02 | Disposition: A | Discharge status: Home / Self Care | Payer: 59 | Source: Ambulatory Visit | Attending: Physician Assistant | Admitting: Physician Assistant

## 2018-03-02 DIAGNOSIS — N6320 Unspecified lump in the left breast, unspecified quadrant: Secondary | ICD-10-CM

## 2021-03-07 ENCOUNTER — Other Ambulatory Visit: Payer: Self-pay | Admitting: Obstetrics and Gynecology

## 2021-03-07 DIAGNOSIS — R928 Other abnormal and inconclusive findings on diagnostic imaging of breast: Secondary | ICD-10-CM

## 2021-04-04 ENCOUNTER — Ambulatory Visit: Payer: Self-pay

## 2021-04-04 ENCOUNTER — Other Ambulatory Visit: Payer: Self-pay

## 2021-04-04 ENCOUNTER — Other Ambulatory Visit: Payer: Self-pay | Admitting: Family Medicine

## 2021-04-04 DIAGNOSIS — M542 Cervicalgia: Secondary | ICD-10-CM

## 2021-04-09 ENCOUNTER — Ambulatory Visit
Admission: RE | Admit: 2021-04-09 | Discharge: 2021-04-09 | Disposition: A | Discharge status: Home / Self Care | Payer: 59 | Source: Ambulatory Visit | Attending: Obstetrics and Gynecology | Admitting: Obstetrics and Gynecology

## 2021-04-09 ENCOUNTER — Other Ambulatory Visit: Payer: Self-pay | Admitting: Obstetrics and Gynecology

## 2021-04-09 DIAGNOSIS — R928 Other abnormal and inconclusive findings on diagnostic imaging of breast: Secondary | ICD-10-CM

## 2021-04-09 DIAGNOSIS — N6489 Other specified disorders of breast: Secondary | ICD-10-CM

## 2021-04-24 ENCOUNTER — Other Ambulatory Visit: Payer: 59

## 2021-04-24 ENCOUNTER — Ambulatory Visit
Admission: RE | Admit: 2021-04-24 | Discharge: 2021-04-24 | Disposition: A | Discharge status: Home / Self Care | Payer: 59 | Source: Ambulatory Visit | Attending: Obstetrics and Gynecology | Admitting: Obstetrics and Gynecology

## 2021-04-24 ENCOUNTER — Other Ambulatory Visit: Payer: Self-pay

## 2021-04-24 DIAGNOSIS — N6489 Other specified disorders of breast: Secondary | ICD-10-CM

## 2021-05-06 ENCOUNTER — Ambulatory Visit: Payer: Self-pay | Admitting: General Surgery

## 2021-05-06 DIAGNOSIS — N6322 Unspecified lump in the left breast, upper inner quadrant: Secondary | ICD-10-CM

## 2021-05-09 ENCOUNTER — Other Ambulatory Visit: Payer: Self-pay | Admitting: General Surgery

## 2021-05-09 DIAGNOSIS — R928 Other abnormal and inconclusive findings on diagnostic imaging of breast: Secondary | ICD-10-CM

## 2021-06-04 ENCOUNTER — Encounter (HOSPITAL_BASED_OUTPATIENT_CLINIC_OR_DEPARTMENT_OTHER): Payer: Self-pay | Admitting: General Surgery

## 2021-06-04 ENCOUNTER — Other Ambulatory Visit: Payer: Self-pay

## 2021-06-11 ENCOUNTER — Ambulatory Visit
Admission: RE | Admit: 2021-06-11 | Discharge: 2021-06-11 | Disposition: A | Discharge status: Home / Self Care | Payer: 59 | Source: Ambulatory Visit | Attending: General Surgery | Admitting: General Surgery

## 2021-06-11 DIAGNOSIS — N6322 Unspecified lump in the left breast, upper inner quadrant: Secondary | ICD-10-CM

## 2021-06-11 MED ORDER — CHLORHEXIDINE GLUCONATE CLOTH 2 % EX PADS: 6.0000 | MEDICATED_PAD | Freq: Once | CUTANEOUS | Status: DC

## 2021-06-11 NOTE — Progress Notes (Signed)

## 2021-06-12 ENCOUNTER — Other Ambulatory Visit: Payer: Self-pay

## 2021-06-12 ENCOUNTER — Ambulatory Visit (HOSPITAL_BASED_OUTPATIENT_CLINIC_OR_DEPARTMENT_OTHER): Payer: 59 | Admitting: Anesthesiology

## 2021-06-12 ENCOUNTER — Ambulatory Visit
Admission: RE | Admit: 2021-06-12 | Discharge: 2021-06-12 | Disposition: A | Discharge status: Home / Self Care | Payer: 59 | Source: Ambulatory Visit | Attending: General Surgery | Admitting: General Surgery

## 2021-06-12 ENCOUNTER — Encounter (HOSPITAL_BASED_OUTPATIENT_CLINIC_OR_DEPARTMENT_OTHER)
Admission: RE | Disposition: A | Discharge status: Home / Self Care | Payer: Self-pay | Source: Home / Self Care | Attending: General Surgery

## 2021-06-12 ENCOUNTER — Ambulatory Visit (HOSPITAL_BASED_OUTPATIENT_CLINIC_OR_DEPARTMENT_OTHER)
Admission: RE | Admit: 2021-06-12 | Discharge: 2021-06-12 | Disposition: A | Discharge status: Home / Self Care | Payer: 59 | Attending: General Surgery | Admitting: General Surgery

## 2021-06-12 ENCOUNTER — Encounter (HOSPITAL_BASED_OUTPATIENT_CLINIC_OR_DEPARTMENT_OTHER): Payer: Self-pay | Admitting: General Surgery

## 2021-06-12 DIAGNOSIS — N6002 Solitary cyst of left breast: Secondary | ICD-10-CM | POA: Diagnosis not present

## 2021-06-12 DIAGNOSIS — F419 Anxiety disorder, unspecified: Secondary | ICD-10-CM | POA: Diagnosis not present

## 2021-06-12 DIAGNOSIS — N6489 Other specified disorders of breast: Secondary | ICD-10-CM

## 2021-06-12 DIAGNOSIS — R928 Other abnormal and inconclusive findings on diagnostic imaging of breast: Secondary | ICD-10-CM | POA: Insufficient documentation

## 2021-06-12 DIAGNOSIS — R519 Headache, unspecified: Secondary | ICD-10-CM | POA: Insufficient documentation

## 2021-06-12 HISTORY — PX: BREAST LUMPECTOMY WITH RADIOACTIVE SEED LOCALIZATION: SHX6424

## 2021-06-12 HISTORY — DX: Anxiety disorder, unspecified: F41.9

## 2021-06-12 HISTORY — DX: Unspecified osteoarthritis, unspecified site: M19.90

## 2021-06-12 SURGERY — BREAST LUMPECTOMY WITH RADIOACTIVE SEED LOCALIZATION
Anesthesia: General | Site: Breast | Laterality: Left

## 2021-06-12 MED ORDER — EPHEDRINE SULFATE (PRESSORS) 50 MG/ML IJ SOLN
INTRAMUSCULAR | Status: DC | PRN
  Administered 2021-06-12 (×2): 10 mg via INTRAVENOUS

## 2021-06-12 MED ORDER — GABAPENTIN 300 MG PO CAPS
300.0000 mg | ORAL_CAPSULE | ORAL | Status: AC
  Administered 2021-06-12: 300 mg via ORAL

## 2021-06-12 MED ORDER — LACTATED RINGERS IV SOLN: INTRAVENOUS | Status: DC | PRN

## 2021-06-12 MED ORDER — DIPHENHYDRAMINE HCL 50 MG/ML IJ SOLN
INTRAMUSCULAR | Status: AC
  Filled 2021-06-12: qty 1

## 2021-06-12 MED ORDER — ONDANSETRON HCL 4 MG/2ML IJ SOLN
INTRAMUSCULAR | Status: DC | PRN
  Administered 2021-06-12: 4 mg via INTRAVENOUS

## 2021-06-12 MED ORDER — VANCOMYCIN HCL IN DEXTROSE 1-5 GM/200ML-% IV SOLN: 1000.0000 mg | INTRAVENOUS | Status: DC

## 2021-06-12 MED ORDER — MIDAZOLAM HCL 2 MG/2ML IJ SOLN
INTRAMUSCULAR | Status: DC | PRN
  Administered 2021-06-12: 2 mg via INTRAVENOUS

## 2021-06-12 MED ORDER — MIDAZOLAM HCL 2 MG/2ML IJ SOLN
INTRAMUSCULAR | Status: AC
  Filled 2021-06-12: qty 2

## 2021-06-12 MED ORDER — CELECOXIB 200 MG PO CAPS
200.0000 mg | ORAL_CAPSULE | ORAL | Status: AC
  Administered 2021-06-12: 200 mg via ORAL

## 2021-06-12 MED ORDER — DEXAMETHASONE SODIUM PHOSPHATE 10 MG/ML IJ SOLN
INTRAMUSCULAR | Status: DC | PRN
  Administered 2021-06-12: 10 mg via INTRAVENOUS

## 2021-06-12 MED ORDER — LIDOCAINE HCL (CARDIAC) PF 100 MG/5ML IV SOSY
PREFILLED_SYRINGE | INTRAVENOUS | Status: DC | PRN
  Administered 2021-06-12: 60 mg via INTRAVENOUS

## 2021-06-12 MED ORDER — VANCOMYCIN HCL 1000 MG IV SOLR
INTRAVENOUS | Status: DC | PRN
  Administered 2021-06-12: 1000 mg via INTRAVENOUS

## 2021-06-12 MED ORDER — OXYCODONE HCL 5 MG PO TABS: 5.0000 mg | ORAL_TABLET | Freq: Four times a day (QID) | ORAL | 0 refills | Status: AC | PRN

## 2021-06-12 MED ORDER — PROPOFOL 10 MG/ML IV BOLUS
INTRAVENOUS | Status: AC
  Filled 2021-06-12: qty 20

## 2021-06-12 MED ORDER — PHENYLEPHRINE HCL (PRESSORS) 10 MG/ML IV SOLN
INTRAVENOUS | Status: DC | PRN
  Administered 2021-06-12 (×4): 80 ug via INTRAVENOUS
  Administered 2021-06-12: 120 ug via INTRAVENOUS
  Administered 2021-06-12: 80 ug via INTRAVENOUS

## 2021-06-12 MED ORDER — HYDROMORPHONE HCL 1 MG/ML IJ SOLN: 0.2500 mg | INTRAMUSCULAR | Status: DC | PRN

## 2021-06-12 MED ORDER — FENTANYL CITRATE (PF) 100 MCG/2ML IJ SOLN
INTRAMUSCULAR | Status: DC | PRN
  Administered 2021-06-12 (×2): 50 ug via INTRAVENOUS

## 2021-06-12 MED ORDER — DEXAMETHASONE SODIUM PHOSPHATE 10 MG/ML IJ SOLN
INTRAMUSCULAR | Status: AC
  Filled 2021-06-12: qty 1

## 2021-06-12 MED ORDER — VANCOMYCIN HCL IN DEXTROSE 1-5 GM/200ML-% IV SOLN
INTRAVENOUS | Status: AC
  Filled 2021-06-12: qty 200

## 2021-06-12 MED ORDER — BUPIVACAINE-EPINEPHRINE (PF) 0.25% -1:200000 IJ SOLN
INTRAMUSCULAR | Status: AC
  Filled 2021-06-12: qty 120

## 2021-06-12 MED ORDER — DIPHENHYDRAMINE HCL 50 MG/ML IJ SOLN
12.5000 mg | Freq: Once | INTRAMUSCULAR | Status: AC
Start: 2021-06-12 — End: 2021-06-12
  Administered 2021-06-12: 12.5 mg via INTRAVENOUS

## 2021-06-12 MED ORDER — BUPIVACAINE-EPINEPHRINE (PF) 0.25% -1:200000 IJ SOLN
INTRAMUSCULAR | Status: DC | PRN
  Administered 2021-06-12: 20 mL

## 2021-06-12 MED ORDER — LIDOCAINE 2% (20 MG/ML) 5 ML SYRINGE
INTRAMUSCULAR | Status: AC
  Filled 2021-06-12: qty 5

## 2021-06-12 MED ORDER — LIDOCAINE-EPINEPHRINE 1 %-1:100000 IJ SOLN
INTRAMUSCULAR | Status: AC
  Filled 2021-06-12: qty 1

## 2021-06-12 MED ORDER — LACTATED RINGERS IV SOLN: INTRAVENOUS | Status: DC

## 2021-06-12 MED ORDER — CELECOXIB 200 MG PO CAPS
ORAL_CAPSULE | ORAL | Status: AC
  Filled 2021-06-12: qty 1

## 2021-06-12 MED ORDER — ACETAMINOPHEN 500 MG PO TABS
1000.0000 mg | ORAL_TABLET | ORAL | Status: AC
  Administered 2021-06-12: 1000 mg via ORAL

## 2021-06-12 MED ORDER — ONDANSETRON HCL 4 MG/2ML IJ SOLN
INTRAMUSCULAR | Status: AC
  Filled 2021-06-12: qty 2

## 2021-06-12 MED ORDER — FENTANYL CITRATE (PF) 100 MCG/2ML IJ SOLN
INTRAMUSCULAR | Status: AC
  Filled 2021-06-12: qty 2

## 2021-06-12 MED ORDER — ACETAMINOPHEN 500 MG PO TABS
ORAL_TABLET | ORAL | Status: AC
  Filled 2021-06-12: qty 2

## 2021-06-12 MED ORDER — GABAPENTIN 300 MG PO CAPS
ORAL_CAPSULE | ORAL | Status: AC
  Filled 2021-06-12: qty 1

## 2021-06-12 MED ORDER — PROPOFOL 10 MG/ML IV BOLUS
INTRAVENOUS | Status: DC | PRN
  Administered 2021-06-12: 150 mg via INTRAVENOUS

## 2021-06-12 SURGICAL SUPPLY — 44 items
ADH SKN CLS APL DERMABOND .7 (GAUZE/BANDAGES/DRESSINGS) ×1
APL PRP STRL LF DISP 70% ISPRP (MISCELLANEOUS) ×1
APPLIER CLIP 9.375 MED OPEN (MISCELLANEOUS) ×2
APR CLP MED 9.3 20 MLT OPN (MISCELLANEOUS) ×1
BLADE SURG 15 STRL LF DISP TIS (BLADE) ×1 IMPLANT
BLADE SURG 15 STRL SS (BLADE) ×2
CANISTER SUC SOCK COL 7IN (MISCELLANEOUS) ×1 IMPLANT
CANISTER SUCT 1200ML W/VALVE (MISCELLANEOUS) ×1 IMPLANT
CHLORAPREP W/TINT 26 (MISCELLANEOUS) ×2 IMPLANT
CLIP APPLIE 9.375 MED OPEN (MISCELLANEOUS) IMPLANT
COVER BACK TABLE 60X90IN (DRAPES) ×2 IMPLANT
COVER MAYO STAND STRL (DRAPES) ×2 IMPLANT
COVER PROBE W GEL 5X96 (DRAPES) ×2 IMPLANT
DERMABOND ADVANCED (GAUZE/BANDAGES/DRESSINGS) ×1
DERMABOND ADVANCED .7 DNX12 (GAUZE/BANDAGES/DRESSINGS) ×1 IMPLANT
DRAPE LAPAROSCOPIC ABDOMINAL (DRAPES) ×2 IMPLANT
DRAPE UTILITY XL STRL (DRAPES) ×2 IMPLANT
ELECT COATED BLADE 2.86 ST (ELECTRODE) ×2 IMPLANT
ELECT REM PT RETURN 9FT ADLT (ELECTROSURGICAL) ×2
ELECTRODE REM PT RTRN 9FT ADLT (ELECTROSURGICAL) ×1 IMPLANT
GLOVE SURG ENC MOIS LTX SZ7.5 (GLOVE) ×2 IMPLANT
GLOVE SURG POLYISO LF SZ7.5 (GLOVE) ×1 IMPLANT
GLOVE SURG UNDER POLY LF SZ7 (GLOVE) ×3 IMPLANT
GOWN STRL REUS W/ TWL LRG LVL3 (GOWN DISPOSABLE) ×2 IMPLANT
GOWN STRL REUS W/TWL LRG LVL3 (GOWN DISPOSABLE) ×4
ILLUMINATOR WAVEGUIDE N/F (MISCELLANEOUS) IMPLANT
KIT MARKER MARGIN INK (KITS) ×2 IMPLANT
LIGHT WAVEGUIDE WIDE FLAT (MISCELLANEOUS) IMPLANT
NDL HYPO 25X1 1.5 SAFETY (NEEDLE) IMPLANT
NEEDLE HYPO 25X1 1.5 SAFETY (NEEDLE) ×2 IMPLANT
NS IRRIG 1000ML POUR BTL (IV SOLUTION) IMPLANT
PACK BASIN DAY SURGERY FS (CUSTOM PROCEDURE TRAY) ×2 IMPLANT
PENCIL SMOKE EVACUATOR (MISCELLANEOUS) ×2 IMPLANT
SLEEVE SCD COMPRESS KNEE MED (STOCKING) ×2 IMPLANT
SPIKE FLUID TRANSFER (MISCELLANEOUS) IMPLANT
SPONGE T-LAP 18X18 ~~LOC~~+RFID (SPONGE) ×2 IMPLANT
SUT MON AB 4-0 PC3 18 (SUTURE) ×2 IMPLANT
SUT SILK 2 0 SH (SUTURE) IMPLANT
SUT VICRYL 3-0 CR8 SH (SUTURE) ×2 IMPLANT
SYR CONTROL 10ML LL (SYRINGE) IMPLANT
TOWEL GREEN STERILE FF (TOWEL DISPOSABLE) ×2 IMPLANT
TRAY FAXITRON CT DISP (TRAY / TRAY PROCEDURE) ×2 IMPLANT
TUBE CONNECTING 20X1/4 (TUBING) ×2 IMPLANT
YANKAUER SUCT BULB TIP NO VENT (SUCTIONS) IMPLANT

## 2021-06-12 NOTE — Op Note (Signed)
06/12/2021  9:15 AM  PATIENT:  [name]  51 y.o. female  PRE-OPERATIVE DIAGNOSIS:  LEFT BREAST DISCORDANCE  POST-OPERATIVE DIAGNOSIS:  LEFT BREAST DISCORDANCE  PROCEDURE:  Procedure(s): LEFT BREAST LUMPECTOMY WITH RADIOACTIVE SEED LOCALIZATION (Left)  SURGEON:  Surgeon(s) and Role:    * Jovita Kussmaul, MD - Primary  PHYSICIAN ASSISTANT:   ASSISTANTS: none   ANESTHESIA:   local and general  EBL:  minimal   BLOOD ADMINISTERED:none  DRAINS: none   LOCAL MEDICATIONS USED:  MARCAINE     SPECIMEN:  Source of Specimen:  left breast tissue  DISPOSITION OF SPECIMEN:  PATHOLOGY  COUNTS:  YES  TOURNIQUET:  * No tourniquets in log *  DICTATION: .Dragon Dictation  After informed consent was obtained the patient was brought to the operating room and placed in the supine position on the operating table.  After adequate induction of general anesthesia the patient's left breast was prepped with ChloraPrep, allowed to dry, and draped in usual sterile manner.  An appropriate timeout was performed.  Previously an I-125 seed was placed in the upper portion of the left breast to mark an area of discordance.  The neoprobe was set to I-125 in the area of radioactivity was readily identified.  The area around this was infiltrated with quarter percent Marcaine.  A curvilinear incision was made along the upper edge of the areola of the left breast with a 15 blade knife.  The incision was carried through the skin and subcutaneous tissue sharply with the electrocautery.  Dissection was then carried superiorly between the breast tissue and the subcutaneous fat and skin.  Once this dissection was beyond the area of the radioactive seed I then removed a circular portion of breast tissue sharply with the electrocautery around the radioactive seed while checking the area of radioactivity frequently.  Once the specimen was removed it was oriented with the appropriate paint colors.  A specimen radiograph  was obtained that showed the clip and seed to be near the center of the specimen.  The specimen was then sent to pathology for further evaluation.  Hemostasis was achieved using the Bovie electrocautery.  The wound was irrigated with saline and infiltrated with more quarter percent Marcaine.  The deep layer of the wound was then closed with layers of interrupted 3-0 Vicryl stitches.  The skin was then closed with interrupted 4-0 Monocryl subcuticular stitches.  Dermabond dressings were applied.  The patient tolerated the procedure well.  At the end of the case all needle sponge and instrument counts were correct.  The patient was then awakened and taken to recovery in stable condition.  PLAN OF CARE: Discharge to home after PACU  PATIENT DISPOSITION:  PACU - hemodynamically stable.   Delay start of Pharmacological VTE agent (>24hrs) due to surgical blood loss or risk of bleeding: not applicable

## 2021-06-12 NOTE — Transfer of Care (Signed)
Immediate Anesthesia Transfer of Care Note  Patient: [name]  Procedure(s) Performed: LEFT BREAST LUMPECTOMY WITH RADIOACTIVE SEED LOCALIZATION (Left: Breast)  Patient Location: PACU  Anesthesia Type:General  Level of Consciousness: awake, alert  and patient cooperative  Airway & Oxygen Therapy: Patient Spontanous Breathing and Patient connected to face mask oxygen  Post-op Assessment: Report given to RN and Post -op Vital signs reviewed and stable  Post vital signs: Reviewed and stable  Last Vitals:  Vitals Value Taken Time  BP 90/47 06/12/21 0920  Temp    Pulse 86 06/12/21 0922  Resp 14 06/12/21 0922  SpO2 97 % 06/12/21 0922  Vitals shown include unvalidated device data.  Last Pain:  Vitals:   06/12/21 0644  TempSrc: Oral  PainSc: 0-No pain      Patients Stated Pain Goal: 4 (06/12/21 5456)  Complications: No notable events documented.

## 2021-06-12 NOTE — Anesthesia Procedure Notes (Signed)
Procedure Name: LMA Insertion Date/Time: 06/12/2021 8:42 AM Performed by: Karen Kitchens, CRNA Pre-anesthesia Checklist: Patient identified, Emergency Drugs available, Suction available and Patient being monitored Patient Re-evaluated:Patient Re-evaluated prior to induction Oxygen Delivery Method: Circle system utilized Preoxygenation: Pre-oxygenation with 100% oxygen Induction Type: IV induction Ventilation: Mask ventilation without difficulty LMA: LMA inserted LMA Size: 4.0 Number of attempts: 1 Airway Equipment and Method: Bite block Placement Confirmation: positive ETCO2, CO2 detector and breath sounds checked- equal and bilateral Tube secured with: Tape Dental Injury: Teeth and Oropharynx as per pre-operative assessment

## 2021-06-12 NOTE — Interval H&P Note (Signed)
History and Physical Interval Note:  06/12/2021 8:18 AM  [name]  has presented today for surgery, with the diagnosis of LEFT BREAST DISCORDANCE.  The various methods of treatment have been discussed with the patient and family. After consideration of risks, benefits and other options for treatment, the patient has consented to  Procedure(s): LEFT BREAST LUMPECTOMY WITH RADIOACTIVE SEED LOCALIZATION (Left) as a surgical intervention.  The patient's history has been reviewed, patient examined, no change in status, stable for surgery.  I have reviewed the patient's chart and labs.  Questions were answered to the patient's satisfaction.     Chevis Pretty III

## 2021-06-12 NOTE — Anesthesia Preprocedure Evaluation (Addendum)
Anesthesia Evaluation  Patient identified by MRN, date of birth, ID band Patient awake    Reviewed: Allergy & Precautions, H&P , NPO status , Patient's Chart, lab work & pertinent test results  Airway Mallampati: II  TM Distance: >3 FB Neck ROM: Full    Dental no notable dental hx. (+) Teeth Intact, Dental Advisory Given   Pulmonary neg pulmonary ROS,    Pulmonary exam normal breath sounds clear to auscultation       Cardiovascular negative cardio ROS   Rhythm:Regular Rate:Normal     Neuro/Psych  Headaches, Anxiety negative neurological ROS  negative psych ROS   GI/Hepatic negative GI ROS, Neg liver ROS,   Endo/Other  negative endocrine ROS  Renal/GU negative Renal ROS  negative genitourinary   Musculoskeletal  (+) Arthritis , Osteoarthritis,    Abdominal   Peds  Hematology negative hematology ROS (+)   Anesthesia Other Findings   Reproductive/Obstetrics negative OB ROS                            Anesthesia Physical Anesthesia Plan  ASA: 2  Anesthesia Plan: General   Post-op Pain Management: Tylenol PO (pre-op)*, Celebrex PO (pre-op)* and Gabapentin PO (pre-op)*   Induction: Intravenous  PONV Risk Score and Plan: 4 or greater and Ondansetron, Dexamethasone and Midazolam  Airway Management Planned: LMA  Additional Equipment:   Intra-op Plan:   Post-operative Plan: Extubation in OR  Informed Consent: I have reviewed the patients History and Physical, chart, labs and discussed the procedure including the risks, benefits and alternatives for the proposed anesthesia with the patient or authorized representative who has indicated his/her understanding and acceptance.     Dental advisory given  Plan Discussed with: CRNA  Anesthesia Plan Comments:         Anesthesia Quick Evaluation

## 2021-06-12 NOTE — Discharge Instructions (Signed)

## 2021-06-12 NOTE — Anesthesia Postprocedure Evaluation (Signed)
Anesthesia Post Note  Patient: [name]  Procedure(s) Performed: LEFT BREAST LUMPECTOMY WITH RADIOACTIVE SEED LOCALIZATION (Left: Breast)     Patient location during evaluation: PACU Anesthesia Type: General Level of consciousness: awake and alert Pain management: pain level controlled Vital Signs Assessment: post-procedure vital signs reviewed and stable Respiratory status: spontaneous breathing, nonlabored ventilation and respiratory function stable Cardiovascular status: blood pressure returned to baseline and stable Postop Assessment: no apparent nausea or vomiting Anesthetic complications: no   No notable events documented.  Last Vitals:  Vitals:   06/12/21 1010 06/12/21 1037  BP:  113/72  Pulse: 66 71  Resp: 13 16  Temp:  36.6 C  SpO2: 98% 99%    Last Pain:  Vitals:   06/12/21 1027  TempSrc:   PainSc: 0-No pain                 Haroldine Redler,W. EDMOND

## 2021-06-12 NOTE — H&P (Signed)
REFERRING PHYSICIAN: Loney Laurence, MD  PROVIDER: Lindell Noe, MD  MRN: [id number] DOB: 1970/12/10 Subjective   Chief Complaint: abnormal mammogram   History of Present Illness: [name] is a 51 y.o. female who is seen today as an office consultation at the request of Dr. Henderson Cloud for evaluation of abnormal mammogram .   We are asked to see the patient in consultation by Dr. Carrington Clamp to evaluate her for left breast distortion. The patient is a 51 year old black female who recently went for a routine mammogram. At that time she was found to have 3 abnormal areas. One on the right turned out to be a simple cyst. The subareolar 1 on the left also turned out to be a simple cyst. The distortion in the upper left breast measuring 8 mm was biopsied and came back benign but was felt to be discordant. She has had no breast issues in the past. She is otherwise healthy. She has no family history of breast cancer. She does not smoke.  Review of Systems: A complete review of systems was obtained from the patient. I have reviewed this information and discussed as appropriate with the patient. See HPI as well for other ROS.  ROS   Medical History: Past Medical History:  Diagnosis Date   Anxiety   Patient Active Problem List  Diagnosis   Mass of upper inner quadrant of left breast   Past Surgical History:  Procedure Laterality Date   csection surgery N/A   HYSTERECTOMY    Allergies  Allergen Reactions   Latex Unknown   Penicillins Unknown   Current Outpatient Medications on File Prior to Visit  Medication Sig Dispense Refill   ALPRAZolam (XANAX) 1 MG tablet TAKE 1/2-1 TAB BY MOUTH TWICE A DAY AS NEEDED FOR PANIC ANXIETY   cholecalciferol (VITAMIN D3) 5,000 unit capsule TAKE 1 CAPSULE BY MOUTH EVERY DAY AS DIRECTED   estradioL (ESTRACE) 0.5 MG tablet   meloxicam (MOBIC) 15 MG tablet meloxicam 15 mg tablet TAKE 1 TABLET EVERY DAY   No current  facility-administered medications on file prior to visit.   History reviewed. No pertinent family history.   Social History   Tobacco Use  Smoking Status Never  Smokeless Tobacco Never    Social History   Socioeconomic History   Marital status: Single  Tobacco Use   Smoking status: Never   Smokeless tobacco: Never  Vaping Use   Vaping Use: Never used  Substance and Sexual Activity   Alcohol use: Never   Drug use: Never   Objective:   Vitals:  BP: 110/74  Pulse: 76  Temp: 36.7 C (98.1 F)  SpO2: 100%  Weight: 86 kg (189 lb 9.6 oz)  Height: 167.6 cm (5\' 6" )   Body mass index is 30.6 kg/m.  Physical Exam Vitals reviewed.  Constitutional:  General: She is not in acute distress. Appearance: Normal appearance.  HENT:  Head: Normocephalic and atraumatic.  Right Ear: External ear normal.  Left Ear: External ear normal.  Nose: Nose normal.  Mouth/Throat:  Mouth: Mucous membranes are moist.  Pharynx: Oropharynx is clear.  Eyes:  General: No scleral icterus. Extraocular Movements: Extraocular movements intact.  Conjunctiva/sclera: Conjunctivae normal.  Pupils: Pupils are equal, round, and reactive to light.  Cardiovascular:  Rate and Rhythm: Normal rate and regular rhythm.  Pulses: Normal pulses.  Heart sounds: Normal heart sounds.  Pulmonary:  Effort: Pulmonary effort is normal. No respiratory distress.  Breath sounds: Normal breath sounds.  Abdominal:  General: Bowel sounds are normal.  Palpations: Abdomen is soft.  Tenderness: There is no abdominal tenderness.  Musculoskeletal:  General: No swelling, tenderness or deformity. Normal range of motion.  Cervical back: Normal range of motion and neck supple.  Skin: General: Skin is warm and dry.  Coloration: Skin is not jaundiced.  Neurological:  General: No focal deficit present.  Mental Status: She is alert and oriented to person, place, and time.  Psychiatric:  Mood and Affect: Mood normal.   Behavior: Behavior normal.     Breast: There is no palpable mass in either breast. There is no palpable axillary, supraclavicular, or cervical lymphadenopathy.  Labs, Imaging and Diagnostic Testing:  Assessment and Plan:   Diagnoses and all orders for this visit:  Mass of upper inner quadrant of left breast - [facility] Case Posting Request; Future    The patient appears to have an 8 mm area of distortion in the upper left breast that was benign but felt to be discordant. Because of this the recommendation is to have this area removed to make sure there is nothing else going on that we are missing. I have discussed with her in detail the risks and benefits of the operation as well as some of the technical aspects including the use of a radioactive seed for localization and she understands and wishes to proceed.

## 2021-06-13 ENCOUNTER — Encounter (HOSPITAL_BASED_OUTPATIENT_CLINIC_OR_DEPARTMENT_OTHER): Payer: Self-pay | Admitting: General Surgery

## 2021-06-18 LAB — SURGICAL PATHOLOGY

## 2021-07-03 ENCOUNTER — Encounter (HOSPITAL_COMMUNITY): Payer: Self-pay

## 2021-07-08 ENCOUNTER — Emergency Department (HOSPITAL_BASED_OUTPATIENT_CLINIC_OR_DEPARTMENT_OTHER): Payer: 59 | Admitting: Radiology

## 2021-07-08 ENCOUNTER — Emergency Department (HOSPITAL_BASED_OUTPATIENT_CLINIC_OR_DEPARTMENT_OTHER)
Admission: EM | Admit: 2021-07-08 | Discharge: 2021-07-08 | Disposition: A | Discharge status: Home / Self Care | Payer: 59 | Attending: Emergency Medicine | Admitting: Emergency Medicine

## 2021-07-08 ENCOUNTER — Other Ambulatory Visit: Payer: Self-pay

## 2021-07-08 DIAGNOSIS — Z20822 Contact with and (suspected) exposure to covid-19: Secondary | ICD-10-CM | POA: Insufficient documentation

## 2021-07-08 DIAGNOSIS — Z91040 Latex allergy status: Secondary | ICD-10-CM | POA: Insufficient documentation

## 2021-07-08 DIAGNOSIS — J069 Acute upper respiratory infection, unspecified: Secondary | ICD-10-CM | POA: Diagnosis not present

## 2021-07-08 DIAGNOSIS — R059 Cough, unspecified: Secondary | ICD-10-CM | POA: Diagnosis present

## 2021-07-08 LAB — RESP PANEL BY RT-PCR (FLU A&B, COVID) ARPGX2
Influenza A by PCR: NEGATIVE
Influenza B by PCR: NEGATIVE
SARS Coronavirus 2 by RT PCR: NEGATIVE

## 2021-07-08 LAB — GROUP A STREP BY PCR: Group A Strep by PCR: NOT DETECTED

## 2021-07-08 MED ORDER — ACETAMINOPHEN 325 MG PO TABS
650.0000 mg | ORAL_TABLET | Freq: Once | ORAL | Status: AC | PRN
Start: 2021-07-08 — End: 2021-07-08
  Administered 2021-07-08: 650 mg via ORAL
  Filled 2021-07-08: qty 2

## 2021-07-08 MED ORDER — BENZONATATE 100 MG PO CAPS: 100.0000 mg | ORAL_CAPSULE | Freq: Three times a day (TID) | ORAL | 0 refills | Status: AC

## 2021-07-08 MED ORDER — ALBUTEROL SULFATE HFA 108 (90 BASE) MCG/ACT IN AERS: 1.0000 | INHALATION_SPRAY | Freq: Four times a day (QID) | RESPIRATORY_TRACT | 0 refills | Status: AC | PRN

## 2021-07-08 MED ORDER — GUAIFENESIN 100 MG/5ML PO LIQD
5.0000 mL | Freq: Once | ORAL | Status: AC
  Administered 2021-07-08: 5 mL via ORAL
  Filled 2021-07-08: qty 10

## 2021-07-08 NOTE — ED Provider Notes (Signed)
?[facility] ?Provider Note ? ? ?CSN: [id number] ?Arrival date & time: 07/08/21  1700 ? ?  ? ?History ? ?Chief Complaint  ?Patient presents with  ? Cough  ? Nasal Congestion  ? ? ?[name] is a 51 y.o. female who presents to the ED for evaluation of productive cough with green sputum, congestion and sore throat that started yesterday and is slowly worsening since onset.  Patient has been treating at home with Tylenol without relief.  She denies fever, chills, abdominal pain, nausea, vomiting, diarrhea.  She is not having any difficulty breathing.  She has no other complaints. ? ? ?Cough ? ?  ? ?Home Medications ?Prior to Admission medications   ?Medication Sig Start Date End Date Taking? Authorizing Provider  ?albuterol (VENTOLIN HFA) 108 (90 Base) MCG/ACT inhaler Inhale 1-2 puffs into the lungs every 6 (six) hours as needed for wheezing or shortness of breath. 07/08/21  Yes Raynald Blend R, PA-C  ?benzonatate (TESSALON) 100 MG capsule Take 1 capsule (100 mg total) by mouth every 8 (eight) hours. 07/08/21  Yes Janell Quiet, PA-C  ?ALPRAZolam (XANAX) 1 MG tablet Take 1 mg by mouth at bedtime as needed for sleep.    [provider]  ?cholecalciferol (VITAMIN D3) 25 MCG (1000 UNIT) tablet Take 1,000 Units by mouth daily.    [provider]  ?estradiol (ESTRACE) 0.5 MG tablet Take 0.5 mg by mouth daily.    [provider]  ?hydrOXYzine (ATARAX) 25 MG tablet Take 25 mg by mouth 3 (three) times daily as needed.    [provider]  ?meloxicam (MOBIC) 15 MG tablet Take 15 mg by mouth daily.    [provider]  ?oxyCODONE (ROXICODONE) 5 MG immediate release tablet Take 1 tablet (5 mg total) by mouth every 6 (six) hours as needed for severe pain. 06/12/21   Griselda Miner, MD  ?valACYclovir (VALTREX) 500 MG tablet Take 500 mg by mouth 2 (two) times daily.    [provider]  ?   ? ?Allergies    ?Latex and Penicillins   ? ?Review of  Systems   ?Review of Systems  ?Respiratory:  Positive for cough.   ? ?Physical Exam ?Updated Vital Signs ?BP 113/85 (BP Location: Right Arm)   Pulse (!) 104   Temp 98.1 ?F (36.7 ?C)   Resp 18   SpO2 100%  ?Physical Exam ?Vitals and nursing note reviewed.  ?Constitutional:   ?   General: She is not in acute distress. ?   Appearance: She is ill-appearing. She is not toxic-appearing.  ?HENT:  ?   Head: Atraumatic.  ?   Nose: Congestion present.  ?Eyes:  ?   Conjunctiva/sclera: Conjunctivae normal.  ?Cardiovascular:  ?   Rate and Rhythm: Normal rate and regular rhythm.  ?   Pulses: Normal pulses.  ?   Heart sounds: No murmur heard. ?Pulmonary:  ?   Effort: Pulmonary effort is normal. No respiratory distress.  ?   Breath sounds: Normal breath sounds.  ?Abdominal:  ?   General: Abdomen is flat. There is no distension.  ?   Palpations: Abdomen is soft.  ?   Tenderness: There is no abdominal tenderness.  ?Musculoskeletal:     ?   General: Normal range of motion.  ?   Cervical back: Normal range of motion.  ?Skin: ?   General: Skin is warm and dry.  ?   Capillary Refill: Capillary refill takes less than 2 seconds.  ?  Neurological:  ?   General: No focal deficit present.  ?   Mental Status: She is alert.  ?Psychiatric:     ?   Mood and Affect: Mood normal.  ? ? ?ED Results / Procedures / Treatments   ?Labs ?(all labs ordered are listed, but only abnormal results are displayed) ?Labs Reviewed  ?GROUP A STREP BY PCR  ?RESP PANEL BY RT-PCR (FLU A&B, COVID) [id number]  ? ? ?EKG ?None ? ?Radiology ?DG Chest 2 View ? ?Result Date: 07/08/2021 ?CLINICAL DATA:  Productive cough. EXAM: CHEST - 2 VIEW COMPARISON:  None. FINDINGS: The heart size and mediastinal contours are within normal limits. Both lungs are clear. The visualized skeletal structures are unremarkable. IMPRESSION: No active cardiopulmonary disease. Electronically Signed   By: Lupita Raider M.D.   On: 07/08/2021 18:41   ? ?Procedures ?Procedures  ? ? ?Medications Ordered  in ED ?Medications  ?acetaminophen (TYLENOL) tablet 650 mg (650 mg Oral Given 07/08/21 1740)  ?guaiFENesin (ROBITUSSIN) 100 MG/5ML liquid 5 mL (5 mLs Oral Given 07/08/21 1844)  ? ? ?ED Course/ Medical Decision Making/ A&P ?  ?                        ?Medical Decision Making ?Amount and/or Complexity of Data Reviewed ?Radiology: ordered. ? ?Risk ?OTC drugs. ?Prescription drug management. ? ? ?History:  ?Per HPI ?Social determinants of health: None ? ?Initial impression: ? ?This patient presents to the ED for concern of cough and congestion, this involves an extensive number of treatment options, and is a complaint that carries with it a high risk of complications and morbidity.    ? ? ?Lab Tests and EKG: ? ?I Ordered, reviewed, and interpreted labs and EKG.  The pertinent results include:  ?COVID, flu, strep test negative ? ? ?Imaging Studies ordered: ? ?I ordered imaging studies including  ?Chest x-ray normal ?I independently visualized and interpreted imaging and I agree with the radiologist interpretation.  ? ? ?Medicines ordered and prescription drug management: ? ?I ordered medication including: ?Mucinex for congestion ?Reevaluation of the patient after these medicines showed that the patient improved ?I have reviewed the patients home medicines and have made adjustments as needed ? ? ?Disposition: ? ?After consideration of the diagnostic results, physical exam, history and the patients response to treatment feel that the patent would benefit from discharge.   ?Viral URI: Patient's symptoms appear very mild.  Her vitals were normal here in the emergency department, she is afebrile.  Labs and imaging were unremarkable.  No further work-up indicated at this time.  Patient discharged home with inhaler and Tessalon Perles for symptom relief.  Over-the-counter symptom relief also discussed.  All questions were asked and answered and she was discharged home in good condition. ? ? ?Final Clinical Impression(s) / ED  Diagnoses ?Final diagnoses:  ?Viral URI with cough  ? ? ?Rx / DC Orders ?ED Discharge Orders   ? ?      Ordered  ?  benzonatate (TESSALON) 100 MG capsule  Every 8 hours       ? 07/08/21 1903  ?  albuterol (VENTOLIN HFA) 108 (90 Base) MCG/ACT inhaler  Every 6 hours PRN       ? 07/08/21 1903  ? ?  ?  ? ?  ? ? ?  ?Janell Quiet, [place] ?07/08/21 1915 ? ?  ?Maia Plan, MD ?07/08/21 2336 ? ?

## 2021-07-08 NOTE — ED Triage Notes (Signed)
Patient arrives with complaints of productive cough, nasal congestion, runny nose and chest congestion x1 day.  ?

## 2021-07-08 NOTE — ED Notes (Signed)
Patient given discharge instructions. Questions were answered. Patient verbalized understanding of discharge instructions and care at home.  

## 2021-07-08 NOTE — Discharge Instructions (Signed)
You were negative today for COVID, flu and strep.  Your chest x-ray was without signs of infection.  I have sent you in a prescription for an inhaler which can you can use if you feel short of breath.  I will also send you in a prescription for [facility] which will help with your cough as well.  You may also use over-the-counter Mucinex/Robitussin which will help with your congestion.  Overall, this is self-limiting and you should feel better in 5 to 7 days.  I recommend sleeping next to a humidifier to help with sore throat and nasal congestion as well. ?

## 2021-12-23 ENCOUNTER — Other Ambulatory Visit: Payer: Self-pay | Admitting: *Deleted

## 2021-12-23 DIAGNOSIS — Z9889 Other specified postprocedural states: Secondary | ICD-10-CM

## 2022-04-10 ENCOUNTER — Ambulatory Visit
Admission: RE | Admit: 2022-04-10 | Discharge: 2022-04-10 | Disposition: A | Discharge status: Home / Self Care | Payer: 59 | Source: Ambulatory Visit | Attending: *Deleted | Admitting: *Deleted

## 2022-04-10 DIAGNOSIS — Z9889 Other specified postprocedural states: Secondary | ICD-10-CM

## 2022-10-09 ENCOUNTER — Encounter (HOSPITAL_BASED_OUTPATIENT_CLINIC_OR_DEPARTMENT_OTHER): Payer: Self-pay | Admitting: Emergency Medicine

## 2022-10-09 ENCOUNTER — Other Ambulatory Visit: Payer: Self-pay

## 2022-10-09 ENCOUNTER — Emergency Department (HOSPITAL_BASED_OUTPATIENT_CLINIC_OR_DEPARTMENT_OTHER)
Admission: EM | Admit: 2022-10-09 | Discharge: 2022-10-10 | Disposition: A | Discharge status: Home / Self Care | Payer: 59 | Attending: Emergency Medicine | Admitting: Emergency Medicine

## 2022-10-09 ENCOUNTER — Emergency Department (HOSPITAL_BASED_OUTPATIENT_CLINIC_OR_DEPARTMENT_OTHER): Payer: 59 | Admitting: Radiology

## 2022-10-09 DIAGNOSIS — Z91040 Latex allergy status: Secondary | ICD-10-CM | POA: Insufficient documentation

## 2022-10-09 DIAGNOSIS — S93492A Sprain of other ligament of left ankle, initial encounter: Secondary | ICD-10-CM | POA: Diagnosis not present

## 2022-10-09 DIAGNOSIS — X501XXA Overexertion from prolonged static or awkward postures, initial encounter: Secondary | ICD-10-CM | POA: Diagnosis not present

## 2022-10-09 DIAGNOSIS — S93402A Sprain of unspecified ligament of left ankle, initial encounter: Secondary | ICD-10-CM

## 2022-10-09 DIAGNOSIS — M25572 Pain in left ankle and joints of left foot: Secondary | ICD-10-CM | POA: Diagnosis present

## 2022-10-09 NOTE — ED Triage Notes (Signed)
Pt presents to ED POV. Pt c/o akle pain since last night. Pt reports that she rolled her ankle 2w ago no pain until last night.

## 2022-10-10 ENCOUNTER — Emergency Department (HOSPITAL_BASED_OUTPATIENT_CLINIC_OR_DEPARTMENT_OTHER): Payer: 59

## 2022-10-10 NOTE — Discharge Instructions (Signed)
Use the boot for the first couple days for support and to take some the pressure off your ankle.  You can take it off for showers and sleep.  After the first couple days start working on walking around without the boot and then you should not need it after a week.  If you have severe pain with walking at 7 days and has not seem to have improved and please follow-up with your doctor for further evaluation and consideration of MRI. Any change or worsening, return to the ED.

## 2022-10-10 NOTE — ED Provider Notes (Signed)
[facility] AT [facility] Provider Note   CSN: [id number] Arrival date & time: 10/09/22  2134     History  Chief Complaint  Patient presents with   Ankle Pain    [name] is a 52 y.o. female.  52 year old female that apparently rolled her ankle couple weeks ago.  Wore some high heels yesterday feels like her ankle hurts again.  No swelling.  No current falls.  Hurts behind her left ankle and left lateral leg just below the knee.   Ankle Pain      Home Medications Prior to Admission medications   Medication Sig Start Date End Date Taking? Authorizing Provider  albuterol (VENTOLIN HFA) 108 (90 Base) MCG/ACT inhaler Inhale 1-2 puffs into the lungs every 6 (six) hours as needed for wheezing or shortness of breath. 07/08/21   Janell Quiet, PA-C  ALPRAZolam Prudy Feeler) 1 MG tablet Take 1 mg by mouth at bedtime as needed for sleep.    [provider]  benzonatate (TESSALON) 100 MG capsule Take 1 capsule (100 mg total) by mouth every 8 (eight) hours. 07/08/21   Janell Quiet, PA-C  cholecalciferol (VITAMIN D3) 25 MCG (1000 UNIT) tablet Take 1,000 Units by mouth daily.    [provider]  estradiol (ESTRACE) 0.5 MG tablet Take 0.5 mg by mouth daily.    [provider]  hydrOXYzine (ATARAX) 25 MG tablet Take 25 mg by mouth 3 (three) times daily as needed.    [provider]  meloxicam (MOBIC) 15 MG tablet Take 15 mg by mouth daily.    [provider]  oxyCODONE (ROXICODONE) 5 MG immediate release tablet Take 1 tablet (5 mg total) by mouth every 6 (six) hours as needed for severe pain. 06/12/21   Griselda Miner, MD  valACYclovir (VALTREX) 500 MG tablet Take 500 mg by mouth 2 (two) times daily.    [provider]      Allergies    Latex and Penicillins    Review of Systems   Review of Systems  Physical Exam Updated Vital Signs BP 132/78   Pulse 96   Temp 98.4 F (36.9 C) (Oral)   Resp  16   SpO2 97%  Physical Exam Vitals and nursing note reviewed.  Constitutional:      Appearance: She is well-developed.  HENT:     Head: Normocephalic and atraumatic.     Mouth/Throat:     Mouth: Mucous membranes are moist.     Pharynx: Oropharynx is clear.  Eyes:     Pupils: Pupils are equal, round, and reactive to light.  Cardiovascular:     Rate and Rhythm: Normal rate and regular rhythm.  Pulmonary:     Effort: No respiratory distress.     Breath sounds: No stridor.  Abdominal:     General: Abdomen is flat. There is no distension.  Musculoskeletal:        General: Tenderness (Left upper fibula and around the left lateral malleolus.) present. Normal range of motion.     Cervical back: Normal range of motion.  Skin:    General: Skin is warm and dry.  Neurological:     General: No focal deficit present.     Mental Status: She is alert.     ED Results / Procedures / Treatments   Labs (all labs ordered are listed, but only abnormal results are displayed) Labs Reviewed - No data to display  EKG None  Radiology DG Tibia/Fibula  Left  Result Date: 10/10/2022 CLINICAL DATA:  Eval for fibular fracture EXAM: LEFT TIBIA AND FIBULA - 2 VIEW COMPARISON:  Ankle radiographs 10/09/2022 FINDINGS: There is no evidence of fracture or other focal bone lesions. Soft tissues are unremarkable. IMPRESSION: Negative. Electronically Signed   By: Minerva Fester M.D.   On: 10/10/2022 01:25   DG Ankle Complete Left  Result Date: 10/09/2022 CLINICAL DATA:  Ankle pain since last night. EXAM: LEFT ANKLE COMPLETE - 3+ VIEW COMPARISON:  None Available. FINDINGS: There is no evidence of fracture, dislocation, or joint effusion. There is no evidence of arthropathy or other focal bone abnormality. Soft tissues are unremarkable. IMPRESSION: Negative. Electronically Signed   By: Minerva Fester M.D.   On: 10/09/2022 23:32    Procedures Procedures    Medications Ordered in ED Medications - No data to  display  ED Course/ Medical Decision Making/ A&P                             Medical Decision Making Amount and/or Complexity of Data Reviewed Radiology: ordered.   XR negative for fracutre. Tib/fib ordered to ensure no proximal fibular fracture. Ankle stable. Will give boot and RICE therapy suggested with pcp follow up as needed if not improving in a week.    Final Clinical Impression(s) / ED Diagnoses Final diagnoses:  Sprain of left ankle, unspecified ligament, initial encounter    Rx / DC Orders ED Discharge Orders     None         Taraann Olthoff, Barbara Cower, MD 10/10/22 [phone]

## 2023-04-06 ENCOUNTER — Other Ambulatory Visit: Payer: Self-pay | Admitting: Obstetrics and Gynecology

## 2023-04-06 DIAGNOSIS — N6320 Unspecified lump in the left breast, unspecified quadrant: Secondary | ICD-10-CM

## 2023-04-07 ENCOUNTER — Encounter: Payer: Self-pay | Admitting: Obstetrics and Gynecology

## 2023-04-07 ENCOUNTER — Ambulatory Visit
Admission: RE | Admit: 2023-04-07 | Discharge: 2023-04-07 | Disposition: A | Discharge status: Home / Self Care | Payer: 59 | Source: Ambulatory Visit | Attending: Obstetrics and Gynecology | Admitting: Obstetrics and Gynecology

## 2023-04-07 ENCOUNTER — Other Ambulatory Visit: Payer: Self-pay | Admitting: Obstetrics and Gynecology

## 2023-04-07 DIAGNOSIS — N6320 Unspecified lump in the left breast, unspecified quadrant: Secondary | ICD-10-CM

## 2023-05-08 ENCOUNTER — Emergency Department (HOSPITAL_BASED_OUTPATIENT_CLINIC_OR_DEPARTMENT_OTHER): Payer: 59 | Admitting: Radiology

## 2023-05-08 ENCOUNTER — Encounter (HOSPITAL_BASED_OUTPATIENT_CLINIC_OR_DEPARTMENT_OTHER): Payer: Self-pay

## 2023-05-08 ENCOUNTER — Other Ambulatory Visit: Payer: Self-pay

## 2023-05-08 ENCOUNTER — Emergency Department (HOSPITAL_BASED_OUTPATIENT_CLINIC_OR_DEPARTMENT_OTHER)
Admission: EM | Admit: 2023-05-08 | Discharge: 2023-05-08 | Disposition: A | Discharge status: Home / Self Care | Payer: 59 | Attending: Emergency Medicine | Admitting: Emergency Medicine

## 2023-05-08 DIAGNOSIS — Z91040 Latex allergy status: Secondary | ICD-10-CM | POA: Insufficient documentation

## 2023-05-08 DIAGNOSIS — Z20822 Contact with and (suspected) exposure to covid-19: Secondary | ICD-10-CM | POA: Insufficient documentation

## 2023-05-08 DIAGNOSIS — R112 Nausea with vomiting, unspecified: Secondary | ICD-10-CM

## 2023-05-08 DIAGNOSIS — J111 Influenza due to unidentified influenza virus with other respiratory manifestations: Secondary | ICD-10-CM | POA: Insufficient documentation

## 2023-05-08 LAB — CBC
HCT: 47.7 % — ABNORMAL HIGH (ref 36.0–46.0)
Hemoglobin: 15.8 g/dL — ABNORMAL HIGH (ref 12.0–15.0)
MCH: 30 pg (ref 26.0–34.0)
MCHC: 33.1 g/dL (ref 30.0–36.0)
MCV: 90.5 fL (ref 80.0–100.0)
Platelets: 262 10*3/uL (ref 150–400)
RBC: 5.27 MIL/uL — ABNORMAL HIGH (ref 3.87–5.11)
RDW: 13 % (ref 11.5–15.5)
WBC: 5.5 10*3/uL (ref 4.0–10.5)
nRBC: 0 % (ref 0.0–0.2)

## 2023-05-08 LAB — LIPASE, BLOOD: Lipase: 28 U/L (ref 11–51)

## 2023-05-08 LAB — COMPREHENSIVE METABOLIC PANEL
ALT: 35 U/L (ref 0–44)
AST: 28 U/L (ref 15–41)
Albumin: 4.3 g/dL (ref 3.5–5.0)
Alkaline Phosphatase: 67 U/L (ref 38–126)
Anion gap: 8 (ref 5–15)
BUN: 17 mg/dL (ref 6–20)
CO2: 26 mmol/L (ref 22–32)
Calcium: 9.1 mg/dL (ref 8.9–10.3)
Chloride: 104 mmol/L (ref 98–111)
Creatinine, Ser: 0.82 mg/dL (ref 0.44–1.00)
GFR, Estimated: >60 mL/min (ref >60)
Glucose, Bld: 99 mg/dL (ref 70–99)
Potassium: 4 mmol/L (ref 3.5–5.1)
Sodium: 138 mmol/L (ref 135–145)
Total Bilirubin: 0.7 mg/dL (ref 0.0–1.2)
Total Protein: 7.7 g/dL (ref 6.5–8.1)

## 2023-05-08 LAB — URINALYSIS, ROUTINE W REFLEX MICROSCOPIC
Bilirubin Urine: NEGATIVE
Glucose, UA: NEGATIVE mg/dL
Hgb urine dipstick: NEGATIVE
Ketones, ur: NEGATIVE mg/dL
Leukocytes,Ua: NEGATIVE
Nitrite: NEGATIVE
Specific Gravity, Urine: 1.029 (ref 1.005–1.030)
pH: 7.5 (ref 5.0–8.0)

## 2023-05-08 LAB — RESP PANEL BY RT-PCR (RSV, FLU A&B, COVID)  RVPGX2
Influenza A by PCR: POSITIVE — AB
Influenza B by PCR: NEGATIVE
Resp Syncytial Virus by PCR: NEGATIVE
SARS Coronavirus 2 by RT PCR: NEGATIVE

## 2023-05-08 MED ORDER — ONDANSETRON 4 MG PO TBDP: 4.0000 mg | ORAL_TABLET | Freq: Three times a day (TID) | ORAL | 0 refills | Status: AC | PRN

## 2023-05-08 MED ORDER — ONDANSETRON HCL 4 MG/2ML IJ SOLN
4.0000 mg | Freq: Once | INTRAMUSCULAR | Status: AC
  Administered 2023-05-08: 4 mg via INTRAVENOUS
  Filled 2023-05-08: qty 2

## 2023-05-08 MED ORDER — SODIUM CHLORIDE 0.9 % IV BOLUS
1000.0000 mL | Freq: Once | INTRAVENOUS | Status: AC
  Administered 2023-05-08: 1000 mL via INTRAVENOUS

## 2023-05-08 NOTE — ED Provider Notes (Signed)
EMERGENCY DEPARTMENT AT [facility] Provider Note   CSN: [id number] Arrival date & time: 05/08/23  1654     History {Add pertinent medical, surgical, social history, OB history to HPI:1} Chief Complaint  Patient presents with   Emesis   Weight Loss    [name] is a 53 y.o. female.  53 year old female who presents to the emergency department with cough, generalized weakness, and vomiting.  Patient reports that on New Year's Day she started experiencing a mild cough.  Says that since then has had intermittent vomiting.  Was seen on the sixth and was started on a Z-Pak and given the flu shot.  Last Monday she was still feeling poorly and was given a second Z-Pak and was diagnosed with the flu.  Since then she reports vomiting approximately once a day that is nonbloody nonbilious.  No significant diarrhea.  Has been having off-and-on fevers.  Says that she has not had an x-ray since her symptoms started.  Told her primary doctor who referred her to the emergency department today.       Home Medications Prior to Admission medications   Medication Sig Start Date End Date Taking? Authorizing Provider  albuterol (VENTOLIN HFA) 108 (90 Base) MCG/ACT inhaler Inhale 1-2 puffs into the lungs every 6 (six) hours as needed for wheezing or shortness of breath. 07/08/21   Janell Quiet, PA-C  ALPRAZolam Prudy Feeler) 1 MG tablet Take 1 mg by mouth at bedtime as needed for sleep.    [provider]  benzonatate (TESSALON) 100 MG capsule Take 1 capsule (100 mg total) by mouth every 8 (eight) hours. 07/08/21   Janell Quiet, PA-C  cholecalciferol (VITAMIN D3) 25 MCG (1000 UNIT) tablet Take 1,000 Units by mouth daily.    [provider]  estradiol (ESTRACE) 0.5 MG tablet Take 0.5 mg by mouth daily.    [provider]  hydrOXYzine (ATARAX) 25 MG tablet Take 25 mg by mouth 3 (three) times daily as needed.    [provider]  meloxicam (MOBIC)  15 MG tablet Take 15 mg by mouth daily.    [provider]  oxyCODONE (ROXICODONE) 5 MG immediate release tablet Take 1 tablet (5 mg total) by mouth every 6 (six) hours as needed for severe pain. 06/12/21   Griselda Miner, MD  valACYclovir (VALTREX) 500 MG tablet Take 500 mg by mouth 2 (two) times daily.    [provider]      Allergies    Latex and Penicillins    Review of Systems   Review of Systems  Physical Exam Updated Vital Signs BP (!) 144/94 (BP Location: Right Arm)   Pulse 66   Temp 97.8 F (36.6 C)   Resp 18   Ht 5\' 6"  (1.676 m)   Wt 73.8 kg   SpO2 98%   BMI 26.24 kg/m  Physical Exam Vitals and nursing note reviewed.  Constitutional:      General: She is not in acute distress.    Appearance: She is well-developed.  HENT:     Head: Normocephalic and atraumatic.     Right Ear: External ear normal.     Left Ear: External ear normal.     Nose: Nose normal.  Eyes:     Extraocular Movements: Extraocular movements intact.     Conjunctiva/sclera: Conjunctivae normal.     Pupils: Pupils are equal, round, and reactive to light.  Cardiovascular:     Rate and Rhythm: Normal  rate and regular rhythm.     Heart sounds: No murmur heard. Pulmonary:     Effort: Pulmonary effort is normal. No respiratory distress.     Breath sounds: Normal breath sounds.  Abdominal:     Palpations: Abdomen is soft.  Musculoskeletal:     Cervical back: Normal range of motion and neck supple.  Skin:    General: Skin is warm and dry.  Neurological:     Mental Status: She is alert and oriented to person, place, and time. Mental status is at baseline.  Psychiatric:        Mood and Affect: Mood normal.     ED Results / Procedures / Treatments   Labs (all labs ordered are listed, but only abnormal results are displayed) Labs Reviewed  RESP PANEL BY RT-PCR (RSV, FLU A&B, COVID)  RVPGX2 - Abnormal; Notable for the following components:      Result Value   Influenza A by  PCR POSITIVE (*)    All other components within normal limits  CBC - Abnormal; Notable for the following components:   RBC 5.27 (*)    Hemoglobin 15.8 (*)    HCT 47.7 (*)    All other components within normal limits  URINALYSIS, ROUTINE W REFLEX MICROSCOPIC - Abnormal; Notable for the following components:   Protein, ur TRACE (*)    All other components within normal limits  LIPASE, BLOOD  COMPREHENSIVE METABOLIC PANEL    EKG None  Radiology No results found.  Procedures Procedures  {Document cardiac monitor, telemetry assessment procedure when appropriate:1}  Medications Ordered in ED Medications  ondansetron (ZOFRAN) injection 4 mg (has no administration in time range)  sodium chloride 0.9 % bolus 1,000 mL (has no administration in time range)    ED Course/ Medical Decision Making/ A&P   {   Click here for ABCD2, HEART and other calculatorsREFRESH Note before signing :1}                              Medical Decision Making Amount and/or Complexity of Data Reviewed Labs: ordered. Radiology: ordered.  Risk Prescription drug management.   ***  {Document critical care time when appropriate:1} {Document review of labs and clinical decision tools ie heart score, Chads2Vasc2 etc:1}  {Document your independent review of radiology images, and any outside records:1} {Document your discussion with family members, caretakers, and with consultants:1} {Document social determinants of health affecting pt's care:1} {Document your decision making why or why not admission, treatments were needed:1} Final Clinical Impression(s) / ED Diagnoses Final diagnoses:  None    Rx / DC Orders ED Discharge Orders     None

## 2023-05-08 NOTE — Discharge Instructions (Signed)
You were seen for flu in the emergency department.   At home, please take the Zofran for your nausea and vomiting.  Be sure to stay well-hydrated.    Check your MyChart online for the results of any tests that had not resulted by the time you left the emergency department.   Follow-up with your primary doctor in 2-3 days regarding your visit.    Return immediately to the emergency department if you experience any of the following: Difficulty breathing, vomiting despite the medications, or any other concerning symptoms.    Thank you for visiting our Emergency Department. It was a pleasure taking care of you today.

## 2023-05-08 NOTE — ED Triage Notes (Signed)
Patient arrives with complaints of weight loss (10 pounds), emesis, and worsening shortness of breath x1 week. Patient states that she was diagnosed with the flu/URI and treated with antibiotics but her symptoms have worsened.  Sent here by her PCP for further evaluation and possible admission.

## 2024-04-24 DIAGNOSIS — Z1231 Encounter for screening mammogram for malignant neoplasm of breast: Secondary | ICD-10-CM
